# Patient Record
Sex: Female | Born: 1970 | Race: White | Hispanic: No | Marital: Married | Smoking: Never smoker
Health system: Southern US, Community
[De-identification: ages and names within clinical notes are randomized; demographics above are authoritative.]

## PROBLEM LIST (undated history)

## (undated) DIAGNOSIS — F419 Anxiety disorder, unspecified: Secondary | ICD-10-CM

## (undated) DIAGNOSIS — Z800 Family history of malignant neoplasm of digestive organs: Secondary | ICD-10-CM

## (undated) DIAGNOSIS — Z803 Family history of malignant neoplasm of breast: Secondary | ICD-10-CM

## (undated) HISTORY — DX: Family history of malignant neoplasm of digestive organs: Z80.0

## (undated) HISTORY — PX: WISDOM TOOTH EXTRACTION: SHX21

## (undated) HISTORY — DX: Family history of malignant neoplasm of breast: Z80.3

---

## 1998-04-24 ENCOUNTER — Inpatient Hospital Stay (HOSPITAL_COMMUNITY): Admission: AD | Admit: 1998-04-24 | Discharge: 1998-04-26 | Payer: Self-pay | Admitting: Obstetrics and Gynecology

## 1998-04-28 ENCOUNTER — Encounter: Admission: RE | Admit: 1998-04-28 | Discharge: 1998-07-27 | Payer: Self-pay | Admitting: Obstetrics and Gynecology

## 1999-06-04 ENCOUNTER — Other Ambulatory Visit: Admission: RE | Admit: 1999-06-04 | Discharge: 1999-06-04 | Payer: Self-pay | Admitting: Obstetrics and Gynecology

## 2000-07-06 ENCOUNTER — Other Ambulatory Visit: Admission: RE | Admit: 2000-07-06 | Discharge: 2000-07-06 | Payer: Self-pay | Admitting: Obstetrics and Gynecology

## 2000-08-05 ENCOUNTER — Encounter: Payer: Self-pay | Admitting: Internal Medicine

## 2000-08-05 ENCOUNTER — Encounter: Admission: RE | Admit: 2000-08-05 | Discharge: 2000-08-05 | Payer: Self-pay | Admitting: Internal Medicine

## 2001-08-10 ENCOUNTER — Other Ambulatory Visit: Admission: RE | Admit: 2001-08-10 | Discharge: 2001-08-10 | Payer: Self-pay | Admitting: Obstetrics and Gynecology

## 2002-08-24 ENCOUNTER — Other Ambulatory Visit: Admission: RE | Admit: 2002-08-24 | Discharge: 2002-08-24 | Payer: Self-pay | Admitting: Obstetrics and Gynecology

## 2003-08-25 ENCOUNTER — Other Ambulatory Visit: Admission: RE | Admit: 2003-08-25 | Discharge: 2003-08-25 | Payer: Self-pay | Admitting: Obstetrics and Gynecology

## 2004-09-27 ENCOUNTER — Other Ambulatory Visit: Admission: RE | Admit: 2004-09-27 | Discharge: 2004-09-27 | Payer: Self-pay | Admitting: Obstetrics and Gynecology

## 2005-11-11 ENCOUNTER — Other Ambulatory Visit: Admission: RE | Admit: 2005-11-11 | Discharge: 2005-11-11 | Payer: Self-pay | Admitting: Obstetrics and Gynecology

## 2016-03-24 ENCOUNTER — Other Ambulatory Visit: Payer: Self-pay | Admitting: Family Medicine

## 2016-03-24 DIAGNOSIS — E010 Iodine-deficiency related diffuse (endemic) goiter: Secondary | ICD-10-CM

## 2016-03-27 ENCOUNTER — Ambulatory Visit
Admission: RE | Admit: 2016-03-27 | Discharge: 2016-03-27 | Disposition: A | Discharge status: Home / Self Care | Payer: 59 | Source: Ambulatory Visit | Attending: Family Medicine | Admitting: Family Medicine

## 2016-03-27 DIAGNOSIS — E010 Iodine-deficiency related diffuse (endemic) goiter: Secondary | ICD-10-CM

## 2017-03-25 ENCOUNTER — Other Ambulatory Visit: Payer: Self-pay | Admitting: Surgery

## 2017-03-25 DIAGNOSIS — E042 Nontoxic multinodular goiter: Secondary | ICD-10-CM

## 2017-04-15 ENCOUNTER — Ambulatory Visit
Admission: RE | Admit: 2017-04-15 | Discharge: 2017-04-15 | Disposition: A | Discharge status: Home / Self Care | Payer: 59 | Source: Ambulatory Visit | Attending: Surgery | Admitting: Surgery

## 2017-04-15 DIAGNOSIS — E042 Nontoxic multinodular goiter: Secondary | ICD-10-CM

## 2019-01-19 ENCOUNTER — Other Ambulatory Visit: Payer: Self-pay | Admitting: Obstetrics and Gynecology

## 2019-01-19 DIAGNOSIS — R928 Other abnormal and inconclusive findings on diagnostic imaging of breast: Secondary | ICD-10-CM

## 2019-01-21 ENCOUNTER — Ambulatory Visit
Admission: RE | Admit: 2019-01-21 | Discharge: 2019-01-21 | Disposition: A | Discharge status: Home / Self Care | Payer: Managed Care, Other (non HMO) | Source: Ambulatory Visit | Attending: Obstetrics and Gynecology | Admitting: Obstetrics and Gynecology

## 2019-01-21 ENCOUNTER — Ambulatory Visit
Admission: RE | Admit: 2019-01-21 | Discharge: 2019-01-21 | Disposition: A | Discharge status: Home / Self Care | Payer: 59 | Source: Ambulatory Visit | Attending: Obstetrics and Gynecology | Admitting: Obstetrics and Gynecology

## 2019-01-21 ENCOUNTER — Other Ambulatory Visit: Payer: Self-pay | Admitting: Obstetrics and Gynecology

## 2019-01-21 DIAGNOSIS — R928 Other abnormal and inconclusive findings on diagnostic imaging of breast: Secondary | ICD-10-CM

## 2019-01-21 DIAGNOSIS — N6489 Other specified disorders of breast: Secondary | ICD-10-CM

## 2019-01-25 ENCOUNTER — Other Ambulatory Visit: Payer: 59

## 2019-01-26 ENCOUNTER — Ambulatory Visit
Admission: RE | Admit: 2019-01-26 | Discharge: 2019-01-26 | Disposition: A | Discharge status: Home / Self Care | Payer: Managed Care, Other (non HMO) | Source: Ambulatory Visit | Attending: Obstetrics and Gynecology | Admitting: Obstetrics and Gynecology

## 2019-01-26 DIAGNOSIS — N6489 Other specified disorders of breast: Secondary | ICD-10-CM

## 2019-02-04 ENCOUNTER — Ambulatory Visit: Payer: Self-pay | Admitting: General Surgery

## 2019-02-04 DIAGNOSIS — N6092 Unspecified benign mammary dysplasia of left breast: Secondary | ICD-10-CM

## 2019-02-08 ENCOUNTER — Other Ambulatory Visit: Payer: Self-pay | Admitting: General Surgery

## 2019-02-08 DIAGNOSIS — N6092 Unspecified benign mammary dysplasia of left breast: Secondary | ICD-10-CM

## 2019-02-24 ENCOUNTER — Other Ambulatory Visit: Payer: Self-pay | Admitting: General Surgery

## 2019-02-24 ENCOUNTER — Other Ambulatory Visit: Payer: Self-pay | Admitting: Family Medicine

## 2019-02-24 DIAGNOSIS — E041 Nontoxic single thyroid nodule: Secondary | ICD-10-CM

## 2019-03-08 ENCOUNTER — Telehealth: Payer: Self-pay | Admitting: Oncology

## 2019-03-08 NOTE — Telephone Encounter (Signed)
A new patient appt has been scheduled for the pt to be seen in the high risk breast clinic on 4/8 at 4pm to see Dr. Darnelle Catalan. Pt aware to arrive 15 minutes early.

## 2019-03-10 ENCOUNTER — Encounter: Payer: Managed Care, Other (non HMO) | Admitting: Licensed Clinical Social Worker

## 2019-03-15 ENCOUNTER — Other Ambulatory Visit: Payer: Self-pay | Admitting: Family Medicine

## 2019-03-15 DIAGNOSIS — E041 Nontoxic single thyroid nodule: Secondary | ICD-10-CM

## 2019-03-16 ENCOUNTER — Other Ambulatory Visit: Payer: Managed Care, Other (non HMO)

## 2019-03-23 ENCOUNTER — Other Ambulatory Visit: Payer: Managed Care, Other (non HMO)

## 2019-03-28 ENCOUNTER — Telehealth: Payer: Self-pay | Admitting: Oncology

## 2019-03-28 NOTE — Telephone Encounter (Signed)
Per 3/31 sch message - called patient to confirm appt - she wants to keep as is on 4/8

## 2019-03-29 NOTE — Progress Notes (Signed)
[facility]  Telephone:([phone]) [phone] [phone]) [phone]     ID: Lindsay Black DOB: January 05, 1971  MR#: [id number]  JAS#:[id number]  Patient Care Team: Ileana Ladd, MD as PCP - General (Family Medicine) Griselda Miner, MD as Consulting Physician (General Surgery) Magrinat, Valentino Hue, MD as Consulting Physician (Oncology) Candice Camp, MD as Consulting Physician (Obstetrics and Gynecology) Darnell Level, MD as Consulting Physician (General Surgery) Graylin Shiver, MD as Consulting Physician (Gastroenterology) Lowella Dell, MD OTHER MD:  CHIEF COMPLAINT: Atypical ductal hyperplasia  CURRENT TREATMENT: Intensified screening   HISTORY OF CURRENT ILLNESS: Lindsay Black had routine screening mammography on 01/17/2019 showing a possible abnormality in the left breast. She underwent bilateral diagnostic mammography with tomography and left breast ultrasonography at [facility] on 01/21/2019 showing: breast density category C; questionable, persistent distortion is seen on the CC projection in the central left breast; no suspicious left axillary lymphadenopathy.  Accordingly on 01/26/2019 she proceeded to biopsy of the left breast area in question. 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   Her definitive surgery is being postponed because of the current pandemic.  The patient's subsequent history is as detailed below.   INTERVAL HISTORY: Lindsay Black was evaluated in the high risk clinic on 03/30/2019 accompanied by her husband via FaceTime.   She is scheduled to undergo left breast lumpectomy on 05/06/2019.   REVIEW OF SYSTEMS: There were no specific symptoms leading to the original mammogram, which was routinely scheduled. The patient denies unusual headaches, visual changes, nausea, vomiting, stiff neck, dizziness, or gait imbalance. There has been no cough, phlegm production, or pleurisy, no chest  pain or pressure, and no change in bowel or bladder habits. The patient denies fever, rash, bleeding, unexplained fatigue or unexplained weight loss. A detailed review of systems was otherwise entirely negative.   PAST MEDICAL HISTORY: Thyroid nodules  PAST SURGICAL HISTORY: C-section, wisdom teeth extraction  FAMILY HISTORY Family History  Problem Relation Age of Onset  . Prostate cancer Father        brachytherapy  . Colon cancer Maternal Uncle   . Breast cancer Maternal Grandmother   . Colon cancer Maternal Grandfather    As of April 2020, patient's father is alive at 72 years old.  He has a history of prostate cancer.  Patient's mother is also alive at age 37. Maternal grandmother was diagnosed with breast cancer in her early 65s.  The patient has 1 sister, no brothers.   GYNECOLOGIC HISTORY:  No LMP recorded. Menarche: 48 years old Age at first live birth: 48 years old GX P 2 LMP within the last month Contraceptive currently on oral contraceptives HRT husband is status post vasectomy Hysterectomy? no BSO? 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 She is married. (Second) husband Lindsay Black is a Emergency planning/management officer for [employer]. Lindsay Black has two children from a previous marriage, ages 32 and 17, that live with them 50% of the time and with their mother the other 50%.  [name], 69 year old, who lives in [place] and works in Public relations account executive, and Lindsay Black, 49 year old, attending APP. at home also there are 4 dogs and 2 cats.  The patient is a International aid/development worker.     ADVANCED DIRECTIVES: Her sister, Lindsay Black, is her HCPOA.  She lives in [place] and can be reached at [phone]   HEALTH MAINTENANCE: Social History  Tobacco Use  . Smoking status: Not on file  Substance Use Topics  . Alcohol use: Not on file  . Drug use: Not on file     Colonoscopy: Jan 2020, repeat in 5 years  PAP: 01/17/2019, negative, Dr.  Rana Snare  Bone density: Jan 2020, osteopenia   No Known Allergies  Current Outpatient Medications  Medication Sig Dispense Refill  . norethindrone-ethinyl estradiol (JUNEL FE,GILDESS FE,LOESTRIN FE) 1-20 MG-MCG tablet Take 1 tablet by mouth daily.    . sertraline (ZOLOFT) 50 MG tablet Take 50 mg by mouth daily. Takes 1.5 tablets daily     No current facility-administered medications for this visit.     OBJECTIVE: Middle-aged white woman who appears well  There were no vitals filed for this visit.   There is no height or weight on file to calculate BMI.   Wt Readings from Last 3 Encounters:  No data found for Wt      ECOG FS:0 - Asymptomatic  Ocular: Sclerae unicteric, pupils round and equal Ear-nose-throat: Oropharynx clear and moist Lymphatic: No cervical or supraclavicular adenopathy Lungs no rales or rhonchi Heart regular rate and rhythm Abd soft, nontender, positive bowel sounds MSK no focal spinal tenderness, no joint edema Neuro: non-focal, well-oriented, appropriate affect Breasts: I do not palpate a mass in either breast.  There are no skin or nipple changes of concern.  Both axillae are benign.   LAB RESULTS:  CMP  No results found for: NA, K, CL, CO2, GLUCOSE, BUN, CREATININE, CALCIUM, PROT, ALBUMIN, AST, ALT, ALKPHOS, BILITOT, GFRNONAA, GFRAA  No results found for: TOTALPROTELP, ALBUMINELP, A1GS, A2GS, BETS, BETA2SER, GAMS, MSPIKE, SPEI  No results found for: KPAFRELGTCHN, LAMBDASER, KAPLAMBRATIO  No results found for: WBC, NEUTROABS, HGB, HCT, MCV, PLT  @  No results found for: LABCA2  No components found for: [id number]  No results for input(s): INR in the last 168 hours.  No results found for: LABCA2  No results found for: WUJ811  No results found for: BJY782  No results found for: NFA213  No results found for: CA2729  No components found for: HGQUANT  No results found for: CEA1 / No results found for: CEA1   No results found  for: AFPTUMOR  No results found for: CHROMOGRNA  No results found for: PSA1  No visits with results within 3 Day(s) from this visit.  Latest known visit with results is:  No results found for any previous visit.    (this displays the last labs from the last 3 days)  No results found for: TOTALPROTELP, ALBUMINELP, A1GS, A2GS, BETS, BETA2SER, GAMS, MSPIKE, SPEI (this displays SPEP labs)  No results found for: KPAFRELGTCHN, LAMBDASER, KAPLAMBRATIO (kappa/lambda light chains)  No results found for: HGBA, HGBA2QUANT, HGBFQUANT, HGBSQUAN (Hemoglobinopathy evaluation)   No results found for: LDH  No results found for: IRON, TIBC, IRONPCTSAT (Iron and TIBC)  No results found for: FERRITIN  Urinalysis No results found for: COLORURINE, APPEARANCEUR, LABSPEC, PHURINE, GLUCOSEU, HGBUR, BILIRUBINUR, KETONESUR, PROTEINUR, UROBILINOGEN, NITRITE, LEUKOCYTESUR   STUDIES: No results found.  ELIGIBLE FOR AVAILABLE RESEARCH PROTOCOL: no  ASSESSMENT: 48 y.o. [place] woman status post left breast biopsy 01/26/2019 showing atypical ductal hyperplasia  (1) definitive surgery pending  (2) opted against risk reduction with antiestrogens  (3) intensified screening to start with breast MRI August 2020    PLAN: I spent approximately 50 minutes face to face with Lindsay Black with more than 50% of that time spent in counseling and coordination of care. Specifically we reviewed the  biology of the patient's diagnosis and the specifics of her situation.  Lindsay Black understands ADH means, first, relatively unrestricted growth of breast cells and, in addition, some morphologically different features from simple hyperplasia. Atypical ductal hyperplasia can be difficult to tell apart ductal cancer in situ. For those reasons lumpectomy was indicated.  ADH is also a marker of breast cancer risk. The risk of developing breast cancer in patients with ADH approaches 1% per year. The new cancer can develop in either  breast. One half of those tumors would be invasive.  Assuming an average life span for women in the [place] today this means her risk of developing breast cancer is in the 30-35% range.  There are 2 ways of dealing with this problem.  One is risk reduction.  The other one is intensified screening.  One way to reduce the risk is bilateral mastectomies.  We discussed this in detail and she understands we do not have data that this saves lives and it would be a very extreme approach to risk reduction in this setting.  She is appropriately not interested in this option.  Another approach to risk reduction is antiestrogens.  Aromatase inhibitors [anastrozole, letrozole and exemestane] are not a choice in pre-menopausal patients. Tamoxifen or raloxefene can be used in pre- or post-menopausal patients.   We discussed these agents including their possible toxicities, side effects and complications.  We also discussed the fact that antiestrogens taken for 5 years would cut her risk of breast cancer in half.   However this does mean that she would need to stop her birth control pills, which she is using not for birth control since her husband is status post vasectomy but for regulation of her menstrual periods.  She understands we think oral contraceptives do slightly increase breast cancer risk, but minimally so.  However going for antiestrogens likely would mean earlier menopause.  This is something she very much prefers to avoid.  After discussing the options for risk reduction, we discussed intensified screening.  This would include yearly mammography with tomography, biannual breast exams by an MD, and a yearly breast MRI.  Lindsay Black understands that adding the MRI may increase cost and also put her at risk for false positives, which may lead to unnecessary procedures.  The point of course would be to find any cancer that may develop at the very earliest stages to increase the chance of cure with minimal interventions  (specifically without chemotherapy).  Lindsay Black was very clear that she does not wish to switch to antiestrogens, but is interested in intensified screening.  Since she had her diagnostic mammogram in February, she will have a breast MRI in August and see me in September.  To continue in this fashion then as she would have her next mammogram in February and then see Lindsay Black for breast exam shortly after that.  Lindsay Black has a good understanding of the overall plan. She agrees with it. She will call with any problems that may develop before her next visit here.   Lowella DellGustav C Magrinat, MD   03/30/2019 5:18 PM Medical Oncology and Hematology [facility] [address] [place], [place] [address] Tel. [phone]    Fax. [phone]   This document serves as a record of services personally performed by Ruthann CancerGustav Magrinat, MD. It was created on his behalf by Mickie BailKatie Daubenspeck, a trained medical scribe. The creation of this record is based on the scribe's personal observations and the provider's statements to them.  I, Lurline Del MD, have reviewed the above documentation for accuracy and completeness, and I agree with the above.

## 2019-03-30 ENCOUNTER — Other Ambulatory Visit: Payer: Self-pay

## 2019-03-30 ENCOUNTER — Inpatient Hospital Stay: Payer: Managed Care, Other (non HMO) | Attending: Oncology | Admitting: Oncology

## 2019-03-30 ENCOUNTER — Encounter: Payer: Self-pay | Admitting: Oncology

## 2019-03-30 DIAGNOSIS — N6092 Unspecified benign mammary dysplasia of left breast: Secondary | ICD-10-CM | POA: Insufficient documentation

## 2019-03-30 DIAGNOSIS — Z9189 Other specified personal risk factors, not elsewhere classified: Secondary | ICD-10-CM | POA: Insufficient documentation

## 2019-05-06 ENCOUNTER — Ambulatory Visit (HOSPITAL_BASED_OUTPATIENT_CLINIC_OR_DEPARTMENT_OTHER): Admit: 2019-05-06 | Payer: Managed Care, Other (non HMO) | Admitting: General Surgery

## 2019-05-06 ENCOUNTER — Encounter (HOSPITAL_BASED_OUTPATIENT_CLINIC_OR_DEPARTMENT_OTHER): Payer: Self-pay

## 2019-05-06 SURGERY — BREAST LUMPECTOMY WITH RADIOACTIVE SEED LOCALIZATION
Anesthesia: General | Site: Breast | Laterality: Left

## 2019-05-18 ENCOUNTER — Other Ambulatory Visit: Payer: Managed Care, Other (non HMO)

## 2019-05-19 ENCOUNTER — Other Ambulatory Visit: Payer: Self-pay

## 2019-05-19 ENCOUNTER — Encounter (HOSPITAL_BASED_OUTPATIENT_CLINIC_OR_DEPARTMENT_OTHER): Payer: Self-pay | Admitting: *Deleted

## 2019-05-23 ENCOUNTER — Other Ambulatory Visit: Payer: Self-pay

## 2019-05-23 ENCOUNTER — Other Ambulatory Visit (HOSPITAL_COMMUNITY)
Admission: RE | Admit: 2019-05-23 | Discharge: 2019-05-23 | Disposition: A | Discharge status: Home / Self Care | Payer: Managed Care, Other (non HMO) | Source: Ambulatory Visit | Attending: General Surgery | Admitting: General Surgery

## 2019-05-23 DIAGNOSIS — Z1159 Encounter for screening for other viral diseases: Secondary | ICD-10-CM | POA: Diagnosis not present

## 2019-05-23 HISTORY — PX: BREAST EXCISIONAL BIOPSY: SUR124

## 2019-05-23 LAB — SARS CORONAVIRUS 2 BY RT PCR (HOSPITAL ORDER, PERFORMED IN ~~LOC~~ HOSPITAL LAB): SARS Coronavirus 2: NEGATIVE

## 2019-05-25 ENCOUNTER — Ambulatory Visit
Admission: RE | Admit: 2019-05-25 | Discharge: 2019-05-25 | Disposition: A | Discharge status: Home / Self Care | Payer: Managed Care, Other (non HMO) | Source: Ambulatory Visit | Attending: General Surgery | Admitting: General Surgery

## 2019-05-25 ENCOUNTER — Other Ambulatory Visit: Payer: Self-pay

## 2019-05-25 DIAGNOSIS — N6092 Unspecified benign mammary dysplasia of left breast: Secondary | ICD-10-CM

## 2019-05-25 NOTE — Progress Notes (Signed)
Ensure pre surgery drink given with instructions, pt verbalized understanding. 

## 2019-05-26 ENCOUNTER — Ambulatory Visit (HOSPITAL_BASED_OUTPATIENT_CLINIC_OR_DEPARTMENT_OTHER)
Admission: RE | Admit: 2019-05-26 | Discharge: 2019-05-26 | Disposition: A | Discharge status: Home / Self Care | Payer: Managed Care, Other (non HMO) | Attending: General Surgery | Admitting: General Surgery

## 2019-05-26 ENCOUNTER — Encounter (HOSPITAL_BASED_OUTPATIENT_CLINIC_OR_DEPARTMENT_OTHER)
Admission: RE | Disposition: A | Discharge status: Home / Self Care | Payer: Self-pay | Source: Home / Self Care | Attending: General Surgery

## 2019-05-26 ENCOUNTER — Encounter (HOSPITAL_BASED_OUTPATIENT_CLINIC_OR_DEPARTMENT_OTHER): Payer: Self-pay | Admitting: Anesthesiology

## 2019-05-26 ENCOUNTER — Ambulatory Visit (HOSPITAL_BASED_OUTPATIENT_CLINIC_OR_DEPARTMENT_OTHER): Payer: Managed Care, Other (non HMO) | Admitting: Anesthesiology

## 2019-05-26 ENCOUNTER — Ambulatory Visit
Admission: RE | Admit: 2019-05-26 | Discharge: 2019-05-26 | Disposition: A | Discharge status: Home / Self Care | Payer: Managed Care, Other (non HMO) | Source: Ambulatory Visit | Attending: General Surgery | Admitting: General Surgery

## 2019-05-26 ENCOUNTER — Other Ambulatory Visit: Payer: Self-pay

## 2019-05-26 DIAGNOSIS — Z803 Family history of malignant neoplasm of breast: Secondary | ICD-10-CM | POA: Diagnosis not present

## 2019-05-26 DIAGNOSIS — N6092 Unspecified benign mammary dysplasia of left breast: Secondary | ICD-10-CM

## 2019-05-26 DIAGNOSIS — N6012 Diffuse cystic mastopathy of left breast: Secondary | ICD-10-CM | POA: Diagnosis not present

## 2019-05-26 DIAGNOSIS — Z79899 Other long term (current) drug therapy: Secondary | ICD-10-CM | POA: Insufficient documentation

## 2019-05-26 HISTORY — DX: Anxiety disorder, unspecified: F41.9

## 2019-05-26 HISTORY — PX: BREAST LUMPECTOMY WITH RADIOACTIVE SEED LOCALIZATION: SHX6424

## 2019-05-26 LAB — POCT PREGNANCY, URINE: Preg Test, Ur: NEGATIVE

## 2019-05-26 SURGERY — BREAST LUMPECTOMY WITH RADIOACTIVE SEED LOCALIZATION
Anesthesia: General | Site: Breast | Laterality: Left

## 2019-05-26 MED ORDER — LIDOCAINE 2% (20 MG/ML) 5 ML SYRINGE
INTRAMUSCULAR | Status: AC
  Filled 2019-05-26: qty 5

## 2019-05-26 MED ORDER — FENTANYL CITRATE (PF) 100 MCG/2ML IJ SOLN
INTRAMUSCULAR | Status: AC
  Filled 2019-05-26: qty 2

## 2019-05-26 MED ORDER — PROPOFOL 10 MG/ML IV BOLUS
INTRAVENOUS | Status: DC | PRN
  Administered 2019-05-26: 150 mg via INTRAVENOUS

## 2019-05-26 MED ORDER — HYDROMORPHONE HCL 1 MG/ML IJ SOLN: 0.2500 mg | INTRAMUSCULAR | Status: DC | PRN

## 2019-05-26 MED ORDER — EPHEDRINE SULFATE 50 MG/ML IJ SOLN
INTRAMUSCULAR | Status: DC | PRN
  Administered 2019-05-26: 10 mg via INTRAVENOUS

## 2019-05-26 MED ORDER — FENTANYL CITRATE (PF) 100 MCG/2ML IJ SOLN
100.0000 ug | INTRAMUSCULAR | Status: DC | PRN
  Administered 2019-05-26: 50 ug via INTRAVENOUS

## 2019-05-26 MED ORDER — BUPIVACAINE-EPINEPHRINE (PF) 0.25% -1:200000 IJ SOLN
INTRAMUSCULAR | Status: DC | PRN
  Administered 2019-05-26: 20 mL

## 2019-05-26 MED ORDER — LACTATED RINGERS IV SOLN
INTRAVENOUS | Status: DC
  Administered 2019-05-26: 11:00:00 via INTRAVENOUS

## 2019-05-26 MED ORDER — DEXAMETHASONE SODIUM PHOSPHATE 4 MG/ML IJ SOLN
INTRAMUSCULAR | Status: DC | PRN
  Administered 2019-05-26: 10 mg via INTRAVENOUS

## 2019-05-26 MED ORDER — MIDAZOLAM HCL 2 MG/2ML IJ SOLN
1.0000 mg | INTRAMUSCULAR | Status: DC | PRN
  Administered 2019-05-26: 2 mg via INTRAVENOUS

## 2019-05-26 MED ORDER — EPHEDRINE 5 MG/ML INJ
INTRAVENOUS | Status: AC
  Filled 2019-05-26: qty 10

## 2019-05-26 MED ORDER — OXYCODONE HCL 5 MG/5ML PO SOLN: 5.0000 mg | Freq: Once | ORAL | Status: DC | PRN

## 2019-05-26 MED ORDER — MEPERIDINE HCL 25 MG/ML IJ SOLN: 6.2500 mg | INTRAMUSCULAR | Status: DC | PRN

## 2019-05-26 MED ORDER — CEFAZOLIN SODIUM-DEXTROSE 2-3 GM-%(50ML) IV SOLR
INTRAVENOUS | Status: DC | PRN
  Administered 2019-05-26: 2 g via INTRAVENOUS

## 2019-05-26 MED ORDER — SCOPOLAMINE 1 MG/3DAYS TD PT72: 1.0000 | MEDICATED_PATCH | Freq: Once | TRANSDERMAL | Status: DC | PRN

## 2019-05-26 MED ORDER — ONDANSETRON HCL 4 MG/2ML IJ SOLN
INTRAMUSCULAR | Status: AC
  Filled 2019-05-26: qty 2

## 2019-05-26 MED ORDER — HYDROCODONE-ACETAMINOPHEN 5-325 MG PO TABS: 1.0000 | ORAL_TABLET | Freq: Four times a day (QID) | ORAL | 0 refills | Status: DC | PRN

## 2019-05-26 MED ORDER — LIDOCAINE 2% (20 MG/ML) 5 ML SYRINGE
INTRAMUSCULAR | Status: DC | PRN
  Administered 2019-05-26: 60 mg via INTRAVENOUS

## 2019-05-26 MED ORDER — FENTANYL CITRATE (PF) 100 MCG/2ML IJ SOLN
50.0000 ug | INTRAMUSCULAR | Status: DC | PRN
  Administered 2019-05-26 (×2): 50 ug via INTRAVENOUS

## 2019-05-26 MED ORDER — ONDANSETRON HCL 4 MG/2ML IJ SOLN
INTRAMUSCULAR | Status: DC | PRN
  Administered 2019-05-26: 4 mg via INTRAVENOUS

## 2019-05-26 MED ORDER — OXYCODONE HCL 5 MG PO TABS: 5.0000 mg | ORAL_TABLET | Freq: Once | ORAL | Status: DC | PRN

## 2019-05-26 MED ORDER — PROMETHAZINE HCL 25 MG/ML IJ SOLN: 6.2500 mg | INTRAMUSCULAR | Status: DC | PRN

## 2019-05-26 MED ORDER — MIDAZOLAM HCL 2 MG/2ML IJ SOLN
INTRAMUSCULAR | Status: AC
  Filled 2019-05-26: qty 2

## 2019-05-26 MED ORDER — DEXAMETHASONE SODIUM PHOSPHATE 10 MG/ML IJ SOLN
INTRAMUSCULAR | Status: AC
  Filled 2019-05-26: qty 1

## 2019-05-26 SURGICAL SUPPLY — 43 items
APPLIER CLIP 9.375 MED OPEN (MISCELLANEOUS) ×3
BLADE SURG 15 STRL LF DISP TIS (BLADE) ×1 IMPLANT
BLADE SURG 15 STRL SS (BLADE) ×2
CANISTER SUC SOCK COL 7IN (MISCELLANEOUS) ×3 IMPLANT
CANISTER SUCT 1200ML W/VALVE (MISCELLANEOUS) ×3 IMPLANT
CHLORAPREP W/TINT 26 (MISCELLANEOUS) ×3 IMPLANT
CLIP APPLIE 9.375 MED OPEN (MISCELLANEOUS) ×1 IMPLANT
COVER BACK TABLE REUSABLE LG (DRAPES) ×3 IMPLANT
COVER MAYO STAND REUSABLE (DRAPES) ×3 IMPLANT
COVER PROBE W GEL 5X96 (DRAPES) ×3 IMPLANT
COVER WAND RF STERILE (DRAPES) IMPLANT
DECANTER SPIKE VIAL GLASS SM (MISCELLANEOUS) IMPLANT
DERMABOND ADVANCED (GAUZE/BANDAGES/DRESSINGS) ×2
DERMABOND ADVANCED .7 DNX12 (GAUZE/BANDAGES/DRESSINGS) ×1 IMPLANT
DRAPE LAPAROSCOPIC ABDOMINAL (DRAPES) ×3 IMPLANT
DRAPE UTILITY XL STRL (DRAPES) ×3 IMPLANT
ELECT COATED BLADE 2.86 ST (ELECTRODE) ×3 IMPLANT
ELECT REM PT RETURN 9FT ADLT (ELECTROSURGICAL) ×3
ELECTRODE REM PT RTRN 9FT ADLT (ELECTROSURGICAL) ×1 IMPLANT
GLOVE BIO SURGEON STRL SZ7.5 (GLOVE) ×6 IMPLANT
GLOVE BIOGEL PI IND STRL 7.0 (GLOVE) ×1 IMPLANT
GLOVE BIOGEL PI INDICATOR 7.0 (GLOVE) ×2
GLOVE SURG SS PI 6.5 STRL IVOR (GLOVE) ×3 IMPLANT
GOWN STRL REUS W/ TWL LRG LVL3 (GOWN DISPOSABLE) ×2 IMPLANT
GOWN STRL REUS W/TWL LRG LVL3 (GOWN DISPOSABLE) ×4
ILLUMINATOR WAVEGUIDE N/F (MISCELLANEOUS) IMPLANT
KIT MARKER MARGIN INK (KITS) ×3 IMPLANT
LIGHT WAVEGUIDE WIDE FLAT (MISCELLANEOUS) IMPLANT
NEEDLE HYPO 25X1 1.5 SAFETY (NEEDLE) ×3 IMPLANT
NS IRRIG 1000ML POUR BTL (IV SOLUTION) IMPLANT
PACK BASIN DAY SURGERY FS (CUSTOM PROCEDURE TRAY) ×3 IMPLANT
PENCIL BUTTON HOLSTER BLD 10FT (ELECTRODE) ×3 IMPLANT
SLEEVE SCD COMPRESS KNEE MED (MISCELLANEOUS) ×3 IMPLANT
SPONGE LAP 18X18 RF (DISPOSABLE) ×3 IMPLANT
SUT MON AB 4-0 PC3 18 (SUTURE) ×3 IMPLANT
SUT SILK 2 0 SH (SUTURE) IMPLANT
SUT VICRYL 3-0 CR8 SH (SUTURE) ×3 IMPLANT
SYR CONTROL 10ML LL (SYRINGE) ×3 IMPLANT
TOWEL GREEN STERILE FF (TOWEL DISPOSABLE) ×3 IMPLANT
TRAY FAXITRON CT DISP (TRAY / TRAY PROCEDURE) ×3 IMPLANT
TUBE CONNECTING 20'X1/4 (TUBING)
TUBE CONNECTING 20X1/4 (TUBING) IMPLANT
YANKAUER SUCT BULB TIP NO VENT (SUCTIONS) IMPLANT

## 2019-05-26 NOTE — Anesthesia Preprocedure Evaluation (Signed)
Anesthesia Evaluation  Patient identified by MRN, date of birth, ID band Patient awake    Reviewed: Allergy & Precautions, NPO status , Patient's Chart, lab work & pertinent test results  Airway Mallampati: II  TM Distance: >3 FB Neck ROM: Full    Dental no notable dental hx.    Pulmonary neg pulmonary ROS,    Pulmonary exam normal breath sounds clear to auscultation       Cardiovascular negative cardio ROS Normal cardiovascular exam Rhythm:Regular Rate:Normal     Neuro/Psych Anxiety negative neurological ROS  negative psych ROS   GI/Hepatic negative GI ROS, Neg liver ROS,   Endo/Other  negative endocrine ROS  Renal/GU negative Renal ROS  negative genitourinary   Musculoskeletal negative musculoskeletal ROS (+)   Abdominal   Peds negative pediatric ROS (+)  Hematology negative hematology ROS (+)   Anesthesia Other Findings Breast Cancer  Reproductive/Obstetrics negative OB ROS                             Anesthesia Physical Anesthesia Plan  ASA: III  Anesthesia Plan: General   Post-op Pain Management:    Induction: Intravenous  PONV Risk Score and Plan: 3 and Ondansetron, Dexamethasone, Midazolam and Treatment may vary due to age or medical condition  Airway Management Planned: LMA  Additional Equipment:   Intra-op Plan:   Post-operative Plan: Extubation in OR  Informed Consent: I have reviewed the patients History and Physical, chart, labs and discussed the procedure including the risks, benefits and alternatives for the proposed anesthesia with the patient or authorized representative who has indicated his/her understanding and acceptance.     Dental advisory given  Plan Discussed with: CRNA  Anesthesia Plan Comments:         Anesthesia Quick Evaluation

## 2019-05-26 NOTE — H&P (Signed)
Lindsay Black  Location: [facility] Patient #: [id number] DOB: 1971/03/09 Married / Language: English / Race: White Female   History of Present Illness  The patient is a 49 year old female who presents with a breast mass. We are asked to see the patient in consultation by Dr. Candice Camp to evaluate her for an area of atypical duct hyperplasia in the left breast. The patient is a 48 year old white female who recently went for a routine screening mammogram. At that time she was found to have a small area of distortion in the 6 o'clock position of the left breast. This was biopsied and came back as atypical duct hyperplasia with a complex sclerosing lesion. She denies any breast pain or discharge from the nipple. Her family history is significant for breast cancer in a maternal grandmother and colon cancer in a grandfather. She does not smoke. She does take hormone replacement. She did have a colonoscopy recently that was clean.   Problem List/Past Medical  MULTIPLE THYROID NODULES (E04.2)  ATYPICAL DUCTAL HYPERPLASIA OF LEFT BREAST (N60.92)   Past Surgical History Cesarean Section - 1  Oral Surgery   Diagnostic Studies History  Colonoscopy  1-5 years ago Mammogram  1-3 years ago Pap Smear  1-5 years ago  Allergies No Known Drug Allergies  Allergies Reconciled   Medication History  Sertraline HCl (50MG  Tablet, Oral) Active. Blisovi 24 Fe (1-20MG -MCG(24) Tablet, Oral) Active. Medications Reconciled  Social History  Alcohol use  Occasional alcohol use. Caffeine use  Coffee. No drug use  Tobacco use  Never smoker.  Family History  Arthritis  Mother. Colon Polyps  Father, Mother. Hypertension  Father. Prostate Cancer  Father.  Pregnancy / Birth History  Age at menarche  10 years. Contraceptive History  Oral contraceptives. Gravida  3 Maternal age  1-25 Para  2 Regular periods   Other Problems  Anxiety Disorder     Review  of Systems General Not Present- Appetite Loss, Chills, Fatigue, Fever, Night Sweats, Weight Gain and Weight Loss. Note: All other systems negative (unless as noted in HPI & included Review of Systems) Skin Not Present- Change in Wart/Mole, Dryness, Hives, Jaundice, New Lesions, Non-Healing Wounds, Rash and Ulcer. HEENT Not Present- Earache, Hearing Loss, Hoarseness, Nose Bleed, Oral Ulcers, Ringing in the Ears, Seasonal Allergies, Sinus Pain, Sore Throat, Visual Disturbances, Wears glasses/contact lenses and Yellow Eyes. Respiratory Not Present- Bloody sputum, Chronic Cough, Difficulty Breathing, Snoring and Wheezing. Breast Not Present- Breast Mass, Breast Pain, Nipple Discharge and Skin Changes. Cardiovascular Not Present- Chest Pain, Difficulty Breathing Lying Down, Leg Cramps, Palpitations, Rapid Heart Rate, Shortness of Breath and Swelling of Extremities. Gastrointestinal Not Present- Abdominal Pain, Bloating, Bloody Stool, Change in Bowel Habits, Chronic diarrhea, Constipation, Difficulty Swallowing, Excessive gas, Gets full quickly at meals, Hemorrhoids, Indigestion, Nausea, Rectal Pain and Vomiting. Female Genitourinary Not Present- Frequency, Nocturia, Painful Urination, Pelvic Pain and Urgency. Musculoskeletal Not Present- Back Pain, Joint Pain, Joint Stiffness, Muscle Pain, Muscle Weakness and Swelling of Extremities. Neurological Not Present- Decreased Memory, Fainting, Headaches, Numbness, Seizures, Tingling, Tremor, Trouble walking and Weakness. Psychiatric Not Present- Anxiety, Bipolar, Change in Sleep Pattern, Depression, Fearful and Frequent crying. Endocrine Not Present- Cold Intolerance, Excessive Hunger, Hair Changes, Heat Intolerance, Hot flashes and New Diabetes. Hematology Not Present- Easy Bruising, Excessive bleeding, Gland problems, HIV and Persistent Infections.  Vitals  Weight: 135 lb Height: 60in Body Surface Area: 1.58 m Body Mass Index: 26.37 kg/m  Temp.:  97.4F(Oral)  Pulse: 80 (Regular)  BP:  110/72 (Sitting, Left Arm, Standard)       Physical Exam  General Mental Status-Alert. General Appearance-Consistent with stated age. Hydration-Well hydrated. Voice-Normal.  Head and Neck Head-normocephalic, atraumatic with no lesions or palpable masses. Trachea-midline. Thyroid Gland Characteristics - normal size and consistency.  Eye Eyeball - Bilateral-Extraocular movements intact. Sclera/Conjunctiva - Bilateral-No scleral icterus.  Chest and Lung Exam Chest and lung exam reveals -quiet, even and easy respiratory effort with no use of accessory muscles and on auscultation, normal breath sounds, no adventitious sounds and normal vocal resonance. Inspection Chest Wall - Normal. Back - normal.  Breast Note: There is a small bruise in the 6 o'clock position of the left breast. There is no palpable mass in either breast. There is no palpable axillary, supraclavicular, or cervical lymphadenopathy.   Cardiovascular Cardiovascular examination reveals -normal heart sounds, regular rate and rhythm with no murmurs and normal pedal pulses bilaterally.  Abdomen Inspection Inspection of the abdomen reveals - No Hernias. Skin - Scar - no surgical scars. Palpation/Percussion Palpation and Percussion of the abdomen reveal - Soft, Non Tender, No Rebound tenderness, No Rigidity (guarding) and No hepatosplenomegaly. Auscultation Auscultation of the abdomen reveals - Bowel sounds normal.  Neurologic Neurologic evaluation reveals -alert and oriented x 3 with no impairment of recent or remote memory. Mental Status-Normal.  Musculoskeletal Normal Exam - Left-Upper Extremity Strength Normal and Lower Extremity Strength Normal. Normal Exam - Right-Upper Extremity Strength Normal and Lower Extremity Strength Normal.  Lymphatic Head & Neck  General Head & Neck Lymphatics: Bilateral - Description -  Normal. Axillary  General Axillary Region: Bilateral - Description - Normal. Tenderness - Non Tender. Femoral & Inguinal  Generalized Femoral & Inguinal Lymphatics: Bilateral - Description - Normal. Tenderness - Non Tender.    Assessment & Plan  ATYPICAL DUCTAL HYPERPLASIA OF LEFT BREAST (N60.92) Impression: The patient appears to have a very small area of atypical duct hyperplasia in the 6 o'clock position of the left breast. Because this can have an appearance similar to ductal carcinoma in situ and because it is considered a high risk lesion would recommend that this area be removed. She would also like to have this done. I have discussed with her in detail the risks and benefits of the operation as well as some of the technical aspects and she understands and wishes to proceed. I'll plan for a left breast radioactive seed localized lumpectomy. We'll also go ahead and refer her to the high risk clinic to talk about risk reduction. Having this diagnosis raises her lifetime risk of breast cancer to somewhere between 30 and 40%. Current Plans Referred to Oncology, for evaluation and follow up (Oncology). Routine. Pt Education - Breast Diseases: discussed with patient and provided information.

## 2019-05-26 NOTE — Progress Notes (Signed)
Computers lost internet, called Dr. Chaney Malling ordered to give Fentanyl IV for pain if no allergies.

## 2019-05-26 NOTE — Op Note (Signed)
05/26/2019  12:00 PM  PATIENT:  Lindsay Black  48 y.o. female  PRE-OPERATIVE DIAGNOSIS:  LEFT BREAST ATYPICAL DUCTAL HYPERPLASIA  POST-OPERATIVE DIAGNOSIS:  LEFT BREAST ATYPICAL DUCTAL HYPERPLASIA  PROCEDURE:  Procedure(s): LEFT BREAST LUMPECTOMY WITH RADIOACTIVE SEED LOCALIZATION (Left)  SURGEON:  Surgeon(s) and Role:    * Griselda Miner, MD - Primary  PHYSICIAN ASSISTANT:   ASSISTANTS: none   ANESTHESIA:   local and general  EBL:  minimal   BLOOD ADMINISTERED:none  DRAINS: none   LOCAL MEDICATIONS USED:  MARCAINE     SPECIMEN:  Source of Specimen:  left breast tissue  DISPOSITION OF SPECIMEN:  PATHOLOGY  COUNTS:  YES  TOURNIQUET:  * No tourniquets in log *  DICTATION: .Dragon Dictation   After informed consent was obtained the patient was brought to the operating room and placed in the supine position on the operating table.  After adequate induction of general anesthesia the patient's left breast was prepped with ChloraPrep, allowed to dry, and draped in usual sterile manner.  An appropriate timeout was performed.  Previously an I-125 seed was placed in the lower central left breast to mark an area of atypical ductal hyperplasia.  The neoprobe was set to I-125 in the area of radioactivity was readily identified.  The inferior subareolar area of the breast was then infiltrated with quarter percent Marcaine.  A small incision was made with a 15 blade knife along the lower edge of the areola.  The incision was carried through the skin and subcutaneous tissue sharply with the electrocautery.  Dissection was then carried out towards the radioactive seed under the direction of the neoprobe.  Once I more closely approached the radioactive seed I then removed a circular portion of breast tissue sharply with the electrocautery around the radioactive seed while checking the area of radioactivity frequently.  Once the specimen was removed it was oriented with the appropriate paint  colors.  A specimen radiograph was obtained that showed the clip and seed to be within the specimen.  The specimen was then sent to pathology for further evaluation.  Hemostasis was achieved using the Bovie electrocautery.  The wound was irrigated with saline and infiltrated with more quarter percent Marcaine.  The deep layer of the wound was then closed with interrupted 3-0 Vicryl stitches.  The skin was then closed with interrupted 4-0 Monocryl subcuticular stitches.  Dermabond dressings were applied.  The patient tolerated the procedure well.  At the end of the case all needle sponge and instrument counts were correct.  The patient was then awakened and taken to recovery in stable condition.  PLAN OF CARE: Discharge to home after PACU  PATIENT DISPOSITION:  PACU - hemodynamically stable.   Delay start of Pharmacological VTE agent (>24hrs) due to surgical blood loss or risk of bleeding: not applicable

## 2019-05-26 NOTE — Transfer of Care (Signed)
Immediate Anesthesia Transfer of Care Note  Patient: Lindsay Black  Procedure(s) Performed: LEFT BREAST LUMPECTOMY WITH RADIOACTIVE SEED LOCALIZATION (Left Breast)  Patient Location: PACU  Anesthesia Type:General  Level of Consciousness: awake, alert  and oriented  Airway & Oxygen Therapy: Patient Spontanous Breathing and Patient connected to face mask oxygen  Post-op Assessment: Report given to RN and Post -op Vital signs reviewed and stable  Post vital signs: Reviewed and stable  Last Vitals:  Vitals Value Taken Time  BP    Temp    Pulse 95 05/26/2019 12:04 PM  Resp 19 05/26/2019 12:04 PM  SpO2 100 % 05/26/2019 12:04 PM  Vitals shown include unvalidated device data.  Last Pain:  Vitals:   05/26/19 1034  TempSrc: Oral  PainSc: 0-No pain         Complications: No apparent anesthesia complications

## 2019-05-26 NOTE — Anesthesia Procedure Notes (Signed)
Procedure Name: LMA Insertion Date/Time: 05/26/2019 11:18 AM Performed by: Caren Macadam, CRNA Pre-anesthesia Checklist: Patient identified, Emergency Drugs available, Suction available and Patient being monitored Patient Re-evaluated:Patient Re-evaluated prior to induction Oxygen Delivery Method: Circle system utilized Preoxygenation: Pre-oxygenation with 100% oxygen Induction Type: IV induction Ventilation: Mask ventilation without difficulty LMA: LMA inserted LMA Size: 4.0 Number of attempts: 1 Placement Confirmation: positive ETCO2 and breath sounds checked- equal and bilateral Tube secured with: Tape Dental Injury: Teeth and Oropharynx as per pre-operative assessment

## 2019-05-26 NOTE — Interval H&P Note (Signed)
History and Physical Interval Note:  05/26/2019 10:55 AM  Lindsay Black  has presented today for surgery, with the diagnosis of LEFT BREAST ATYPICAL DUCTAL HYPERPLASIA.  The various methods of treatment have been discussed with the patient and family. After consideration of risks, benefits and other options for treatment, the patient has consented to  Procedure(s): LEFT BREAST LUMPECTOMY WITH RADIOACTIVE SEED LOCALIZATION (Left) as a surgical intervention.  The patient's history has been reviewed, patient examined, no change in status, stable for surgery.  I have reviewed the patient's chart and labs.  Questions were answered to the patient's satisfaction.     Chevis Pretty III

## 2019-05-26 NOTE — Anesthesia Postprocedure Evaluation (Signed)
Anesthesia Post Note  Patient: Lindsay Black  Procedure(s) Performed: LEFT BREAST LUMPECTOMY WITH RADIOACTIVE SEED LOCALIZATION (Left Breast)     Patient location during evaluation: PACU Anesthesia Type: General Level of consciousness: awake and alert Pain management: pain level controlled Vital Signs Assessment: post-procedure vital signs reviewed and stable Respiratory status: spontaneous breathing, nonlabored ventilation, respiratory function stable and patient connected to nasal cannula oxygen Cardiovascular status: blood pressure returned to baseline and stable Postop Assessment: no apparent nausea or vomiting Anesthetic complications: no    Last Vitals:  Vitals:   05/26/19 1300 05/26/19 1315  BP: 117/73 130/72  Pulse: 73 78  Resp: (!) 25 20  Temp:  36.9 C  SpO2: 99% 100%    Last Pain:  Vitals:   05/26/19 1315  TempSrc:   PainSc: 0-No pain                 Ryen Rhames S

## 2019-05-26 NOTE — Discharge Instructions (Signed)
  Post Anesthesia Home Care Instructions  Activity: Get plenty of rest for the remainder of the day. A responsible individual must stay with you for 24 hours following the procedure.  For the next 24 hours, DO NOT: -Drive a car -Operate machinery -Drink alcoholic beverages -Take any medication unless instructed by your physician -Make any legal decisions or sign important papers.  Meals: Start with liquid foods such as gelatin or soup. Progress to regular foods as tolerated. Avoid greasy, spicy, heavy foods. If nausea and/or vomiting occur, drink only clear liquids until the nausea and/or vomiting subsides. Call your physician if vomiting continues.  Special Instructions/Symptoms: Your throat may feel dry or sore from the anesthesia or the breathing tube placed in your throat during surgery. If this causes discomfort, gargle with warm salt water. The discomfort should disappear within 24 hours.  If you had a scopolamine patch placed behind your ear for the management of post- operative nausea and/or vomiting:  1. The medication in the patch is effective for 72 hours, after which it should be removed.  Wrap patch in a tissue and discard in the trash. Wash hands thoroughly with soap and water. 2. You may remove the patch earlier than 72 hours if you experience unpleasant side effects which may include dry mouth, dizziness or visual disturbances. 3. Avoid touching the patch. Wash your hands with soap and water after contact with the patch.    Post Anesthesia Home Care Instructions  Activity: Get plenty of rest for the remainder of the day. A responsible individual must stay with you for 24 hours following the procedure.  For the next 24 hours, DO NOT: -Drive a car -Operate machinery -Drink alcoholic beverages -Take any medication unless instructed by your physician -Make any legal decisions or sign important papers.  Meals: Start with liquid foods such as gelatin or soup. Progress to  regular foods as tolerated. Avoid greasy, spicy, heavy foods. If nausea and/or vomiting occur, drink only clear liquids until the nausea and/or vomiting subsides. Call your physician if vomiting continues.  Special Instructions/Symptoms: Your throat may feel dry or sore from the anesthesia or the breathing tube placed in your throat during surgery. If this causes discomfort, gargle with warm salt water. The discomfort should disappear within 24 hours.  If you had a scopolamine patch placed behind your ear for the management of post- operative nausea and/or vomiting:  1. The medication in the patch is effective for 72 hours, after which it should be removed.  Wrap patch in a tissue and discard in the trash. Wash hands thoroughly with soap and water. 2. You may remove the patch earlier than 72 hours if you experience unpleasant side effects which may include dry mouth, dizziness or visual disturbances. 3. Avoid touching the patch. Wash your hands with soap and water after contact with the patch.    

## 2019-05-27 ENCOUNTER — Encounter (HOSPITAL_BASED_OUTPATIENT_CLINIC_OR_DEPARTMENT_OTHER): Payer: Self-pay | Admitting: General Surgery

## 2019-05-30 ENCOUNTER — Other Ambulatory Visit: Payer: Self-pay | Admitting: Oncology

## 2019-06-03 ENCOUNTER — Ambulatory Visit
Admission: RE | Admit: 2019-06-03 | Discharge: 2019-06-03 | Disposition: A | Discharge status: Home / Self Care | Payer: Managed Care, Other (non HMO) | Source: Ambulatory Visit | Attending: Family Medicine | Admitting: Family Medicine

## 2019-06-03 ENCOUNTER — Encounter: Payer: Self-pay | Admitting: Oncology

## 2019-06-03 DIAGNOSIS — E041 Nontoxic single thyroid nodule: Secondary | ICD-10-CM

## 2019-08-18 ENCOUNTER — Other Ambulatory Visit: Payer: Self-pay

## 2019-08-18 ENCOUNTER — Ambulatory Visit
Admission: RE | Admit: 2019-08-18 | Discharge: 2019-08-18 | Disposition: A | Discharge status: Home / Self Care | Payer: Managed Care, Other (non HMO) | Source: Ambulatory Visit | Attending: Oncology | Admitting: Oncology

## 2019-08-18 DIAGNOSIS — N6092 Unspecified benign mammary dysplasia of left breast: Secondary | ICD-10-CM

## 2019-08-18 DIAGNOSIS — Z9189 Other specified personal risk factors, not elsewhere classified: Secondary | ICD-10-CM

## 2019-08-18 MED ORDER — GADOBUTROL 1 MMOL/ML IV SOLN
6.0000 mL | Freq: Once | INTRAVENOUS | Status: AC | PRN
  Administered 2019-08-18: 6 mL via INTRAVENOUS

## 2019-08-23 ENCOUNTER — Other Ambulatory Visit: Payer: Self-pay | Admitting: Oncology

## 2019-08-23 ENCOUNTER — Ambulatory Visit: Payer: Managed Care, Other (non HMO) | Admitting: Oncology

## 2019-08-23 DIAGNOSIS — N6092 Unspecified benign mammary dysplasia of left breast: Secondary | ICD-10-CM

## 2019-08-23 HISTORY — PX: BREAST BIOPSY: SHX20

## 2019-08-24 ENCOUNTER — Encounter: Payer: Self-pay | Admitting: Oncology

## 2019-08-24 ENCOUNTER — Other Ambulatory Visit: Payer: Self-pay | Admitting: Oncology

## 2019-08-25 ENCOUNTER — Other Ambulatory Visit: Payer: Self-pay | Admitting: Oncology

## 2019-08-25 NOTE — Progress Notes (Unsigned)
I called Lindsay Black and apologize for the delay in calling her back with the MRI results.  She is set up for biopsy under ultrasound next week of the 6 mm lesion noted in the right breast.  She understands and I think this is going to be benign but he does need to be improved.  I will call her with the biopsy results as soon as I get  Incidentally her father died on 09/08/2019 from throat cancer.  He has been diagnosed only 3 months before.

## 2019-08-30 ENCOUNTER — Ambulatory Visit
Admission: RE | Admit: 2019-08-30 | Discharge: 2019-08-30 | Disposition: A | Discharge status: Home / Self Care | Payer: Managed Care, Other (non HMO) | Source: Ambulatory Visit | Attending: Oncology | Admitting: Oncology

## 2019-08-30 ENCOUNTER — Other Ambulatory Visit: Payer: Self-pay

## 2019-08-30 ENCOUNTER — Other Ambulatory Visit: Payer: Self-pay | Admitting: Oncology

## 2019-08-30 DIAGNOSIS — N6092 Unspecified benign mammary dysplasia of left breast: Secondary | ICD-10-CM

## 2019-08-31 ENCOUNTER — Inpatient Hospital Stay: Payer: Managed Care, Other (non HMO) | Admitting: Oncology

## 2019-09-01 ENCOUNTER — Other Ambulatory Visit: Payer: Self-pay | Admitting: Oncology

## 2019-09-01 DIAGNOSIS — Z1231 Encounter for screening mammogram for malignant neoplasm of breast: Secondary | ICD-10-CM

## 2019-09-01 NOTE — Progress Notes (Signed)
I called Lindsay Black and discussed her benign pathology.  She does not need to come see me next week to discuss.  She went off birth control pills 3 months ago and has not had a period.  If she does not have a menstrual period by November she will call and I will obtain labs to question her ovaries.  She will have her next mammogram in February and she will have a breast MRI in August.  She will return to see me a year from now  She knows to call for any other issues that may develop before then.

## 2019-09-05 ENCOUNTER — Telehealth: Payer: Self-pay | Admitting: Oncology

## 2019-09-05 NOTE — Telephone Encounter (Signed)
Scheduled appt per 9/10 sch message - pt aware of new appts date and time

## 2019-09-06 ENCOUNTER — Other Ambulatory Visit: Payer: Self-pay | Admitting: Oncology

## 2019-09-06 ENCOUNTER — Inpatient Hospital Stay: Payer: Managed Care, Other (non HMO) | Admitting: Oncology

## 2019-09-07 ENCOUNTER — Telehealth: Payer: Self-pay | Admitting: Adult Health

## 2019-09-07 NOTE — Telephone Encounter (Signed)
Called to request peer to peer for MRI of breasts. I did not have the case ID, insurance ID, or J code readily available, so it took the support team additional time to look it up.  After 15 minutes, I was scheduled to have peer to peer with Dr. Otelia Santee tomorrow morning at 0915.  Wilber Bihari, NP

## 2019-09-13 ENCOUNTER — Telehealth: Payer: Self-pay | Admitting: Genetic Counselor

## 2019-09-13 NOTE — Telephone Encounter (Signed)
Called patient regarding upcoming Webex appointment, per patient's request this will be a walk-in visit. °

## 2019-09-14 ENCOUNTER — Encounter: Payer: Self-pay | Admitting: Genetic Counselor

## 2019-09-14 ENCOUNTER — Other Ambulatory Visit: Payer: Self-pay

## 2019-09-14 ENCOUNTER — Inpatient Hospital Stay: Payer: Managed Care, Other (non HMO)

## 2019-09-14 ENCOUNTER — Inpatient Hospital Stay: Payer: Managed Care, Other (non HMO) | Attending: Genetic Counselor | Admitting: Genetic Counselor

## 2019-09-14 ENCOUNTER — Other Ambulatory Visit: Payer: Self-pay | Admitting: Genetic Counselor

## 2019-09-14 DIAGNOSIS — Z9189 Other specified personal risk factors, not elsewhere classified: Secondary | ICD-10-CM

## 2019-09-14 DIAGNOSIS — N6092 Unspecified benign mammary dysplasia of left breast: Secondary | ICD-10-CM

## 2019-09-14 DIAGNOSIS — Z803 Family history of malignant neoplasm of breast: Secondary | ICD-10-CM | POA: Insufficient documentation

## 2019-09-14 DIAGNOSIS — Z800 Family history of malignant neoplasm of digestive organs: Secondary | ICD-10-CM | POA: Diagnosis not present

## 2019-09-14 NOTE — Progress Notes (Signed)
REFERRING PROVIDER: Chauncey Cruel, MD [address] [place],  [place] [address]  PRIMARY PROVIDER:  Vernie Shanks, MD  PRIMARY REASON FOR VISIT:  1. Atypical ductal hyperplasia of left breast   2. Family history of breast cancer   3. Family history of colon cancer      HISTORY OF PRESENT ILLNESS:   [name], a 48 y.o. female, was seen for a [place] cancer genetics consultation at the request of Dr. Jana Hakim due to a family history of breast and colon cancer.  Lindsay Black presents to clinic today to discuss the possibility of a hereditary predisposition to cancer, genetic testing, and to further clarify her future cancer risks, as well as potential cancer risks for family members.   Lindsay Black is a 48 y.o. female with no personal history of cancer.  She was found to have ADH of her left breast and had a lumpectomy.    CANCER HISTORY:  Oncology History   No history exists.     RISK FACTORS:  Menarche was at age 41.  First live birth at age 43.  OCP use for approximately 27 years.  Ovaries intact: yes.  Hysterectomy: no.  Menopausal status: perimenopausal.  HRT use: 0 years. Colonoscopy: yes; normal. Mammogram within the last year: yes. Number of breast biopsies: 2. Up to date with pelvic exams: yes. Any excessive radiation exposure in the past: no  Past Medical History:  Diagnosis Date  . Anxiety   . Family history of breast cancer   . Family history of colon cancer     Past Surgical History:  Procedure Laterality Date  . BREAST LUMPECTOMY WITH RADIOACTIVE SEED LOCALIZATION Left 05/26/2019   Procedure: LEFT BREAST LUMPECTOMY WITH RADIOACTIVE SEED LOCALIZATION;  Surgeon: Jovita Kussmaul, MD;  Location: [facility];  Service: General;  Laterality: Left;  . CESAREAN SECTION    . WISDOM TOOTH EXTRACTION      Social History   Socioeconomic History  . Marital status: Married    Spouse name: Not on file  . Number of children: Not on file   . Years of education: Not on file  . Highest education level: Not on file  Occupational History  . Not on file  Social Needs  . Financial resource strain: Not on file  . Food insecurity    Worry: Not on file    Inability: Not on file  . Transportation needs    Medical: Not on file    Non-medical: Not on file  Tobacco Use  . Smoking status: Never Smoker  . Smokeless tobacco: Never Used  Substance and Sexual Activity  . Alcohol use: Yes    Frequency: Never    Comment: occas  . Drug use: Never  . Sexual activity: Not on file    Comment: husband has had vasectomy  Lifestyle  . Physical activity    Days per week: Not on file    Minutes per session: Not on file  . Stress: Not on file  Relationships  . Social Herbalist on phone: Not on file    Gets together: Not on file    Attends religious service: Not on file    Active member of club or organization: Not on file    Attends meetings of clubs or organizations: Not on file    Relationship status: Not on file  Other Topics Concern  . Not on file  Social History Narrative  . Not on file  FAMILY HISTORY:  We obtained a detailed, 4-generation family history.  Significant diagnoses are listed below: Family History  Problem Relation Age of Onset  . Prostate cancer Father        brachytherapy  . Throat cancer Father        d. 76  . Colon cancer Maternal Uncle        dx mid 39s  . Breast cancer Maternal Grandmother        dx early 17s; d. 95  . Colon cancer Maternal Grandfather 30       d. 45  . Dementia Paternal Grandmother   . Throat cancer Paternal Grandfather        d. 53s  . Metabolic syndrome Daughter        methylmalonic aciduria    The patient has two daughters who are cancer free, however one has a diagnosis of methylmalonic aciduria.  She has one sister who is cancer free.  Her mother is living and her father is deceased.  The patient's father had prostate cancer in his 41's and was diagnosed  with throat cancer at 68.  He died in August 20, 2019.  He was an only child.  His mother died from dementia complications and his father died of throat cancer.  The patient's mother is living at 14.  She has two sisters and two brothers.  One brother had colon cancer in his mid 51's.  Her maternal grandmother was diagnosed with breast cancer in her early 7's and her grandfather was diagnosed with colon cancer in his mid 28's.  Lindsay Black is unaware of previous family history of genetic testing for hereditary cancer risks. Patient's maternal ancestors are of Caucasian descent, and paternal ancestors are of Caucasian descent. There is no reported Ashkenazi Jewish ancestry. There is no known consanguinity.  GENETIC COUNSELING ASSESSMENT: Lindsay Black is a 48 y.o. female with a family history of cancer which is somewhat suggestive of a familial predisposition to cancer given the older ages of onset of cancer. We, therefore, discussed and recommended the following at today's visit.   DISCUSSION: We discussed that 5 - 10% of cancer is hereditary.  Depending on the cancer type, the risk for a hereditary cancer will differ.  For instance, about 5-10% of breast cancer, 5-7% of colon cancer and up to 15% of prostate cancer is hereditary.  We discussed with Lindsay Black that the family history does not meet insurance or NCCN criteria for genetic testing and, therefore, is not highly consistent with a familial hereditary cancer syndrome.  We feel she is at low risk to harbor a gene mutation associated with such a condition.   We disucssed that genetic testing could still be performed but there would be a $250 out of pocket cost.  The patient works at [employer], and can get testing performed for free.  We discussed that we don't typically order from [facility] due to other laboratories having more experience and a larger share of the testing and therefore have seen more of the variants.  She understands, but would like to  pursue genetic testing through [employer], and can do this through her provider.  We discussed the potential outcomes of testing including a positive, negative, carrier or variant of uncertain significance.    PLAN:  Ms. Vause did not wish to pursue genetic testing at today's visit, but instead will reach out to her primary care provider. We understand this decision and remain available to discuss genetic testing  results with her once they come back. We, therefore, recommend Ms. Stumpp continue to follow the cancer screening guidelines given by her primary healthcare provider.  Lastly, we encouraged Ms. Galeas to remain in contact with cancer genetics annually so that we can continuously update the family history and inform her of any changes in cancer genetics and testing that may be of benefit for this family.   Ms. Madding questions were answered to her satisfaction today. Our contact information was provided should additional questions or concerns arise. Thank you for the referral and allowing [place] to share in the care of your patient.    P. Lowell Guitar, MS, [facility] Licensed, Patent attorney [email or web address] phone: [phone]  The patient was seen for a total of 30 minutes in face-to-face genetic counseling.  This patient was discussed with Drs. Magrinat, Pamelia Hoit and/or Mosetta Putt who agrees with the above.    _______________________________________________________________________ For Office Staff:  Number of people involved in session: 1 Was an Intern/ student involved with case: no

## 2019-10-13 ENCOUNTER — Other Ambulatory Visit: Payer: Self-pay

## 2019-10-13 DIAGNOSIS — Z20822 Contact with and (suspected) exposure to covid-19: Secondary | ICD-10-CM

## 2019-10-15 LAB — NOVEL CORONAVIRUS, NAA: SARS-CoV-2, NAA: NOT DETECTED

## 2019-12-17 IMAGING — MG MM CLIP PLACEMENT
6 series · 6 of 14 positions shown · non-contrast
Comparison: Previous exam(s).

CLINICAL DATA: Status post stereotactic core needle biopsy of an
architectural distortion in the left breast.

EXAM:
DIAGNOSTIC LEFT MAMMOGRAM POST STEREOTACTIC BIOPSY

[L CC]
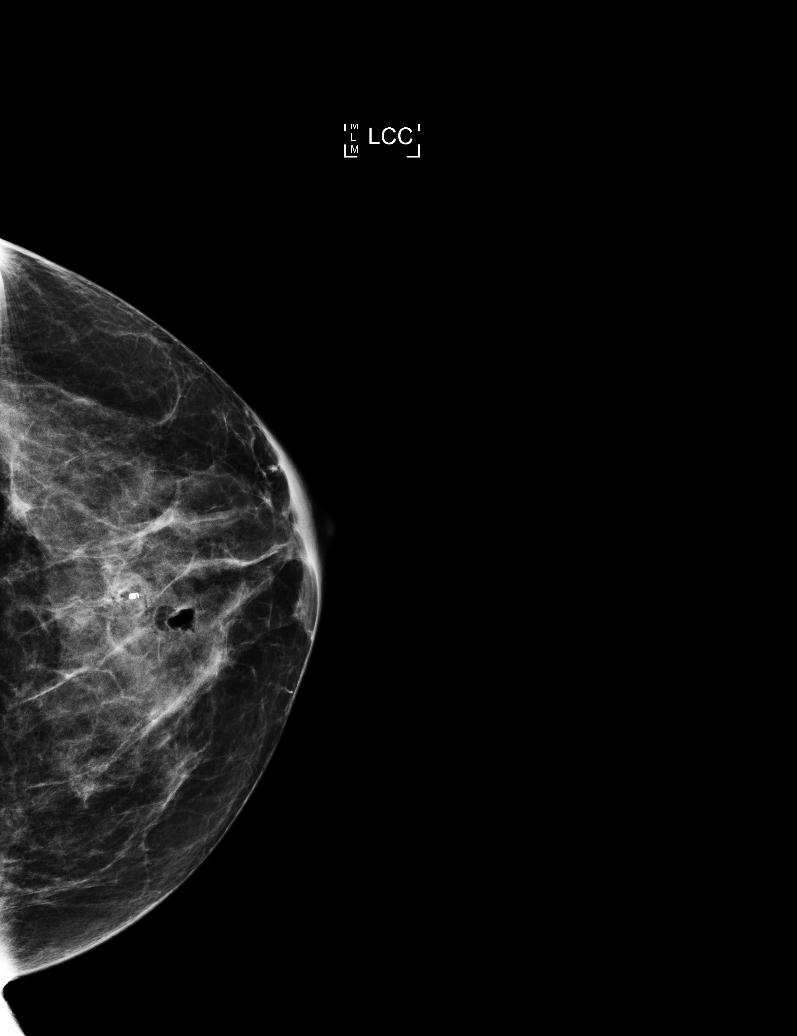

[L CC synth-2D]
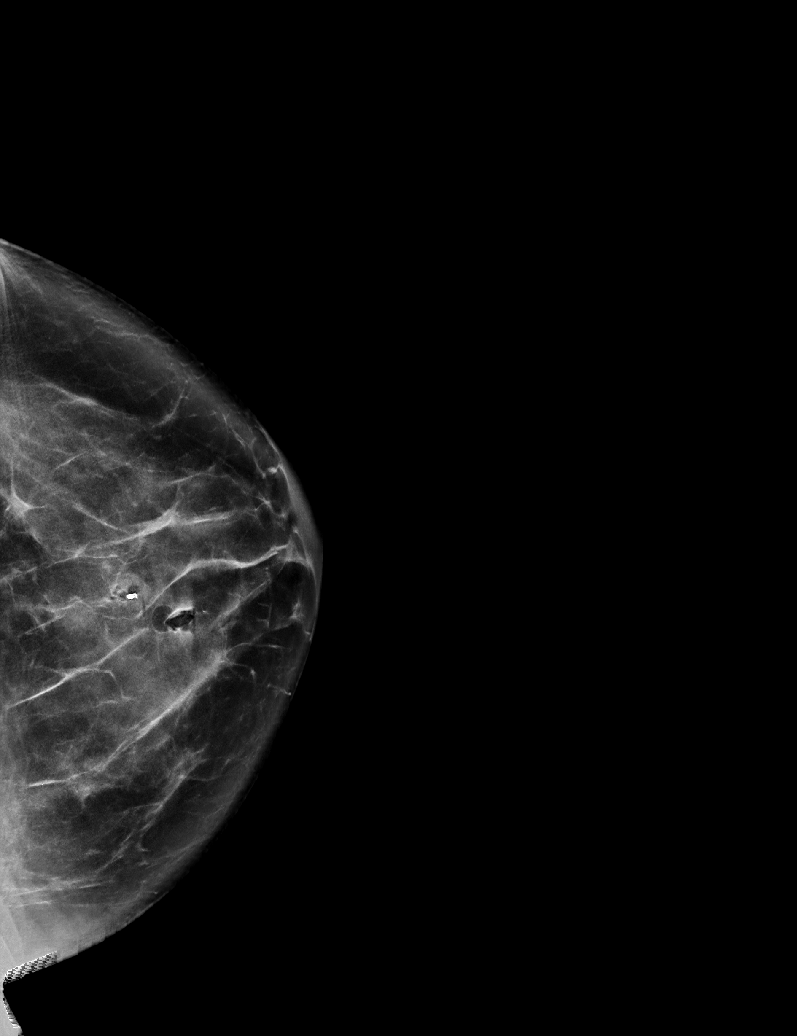

[L ML synth-2D]
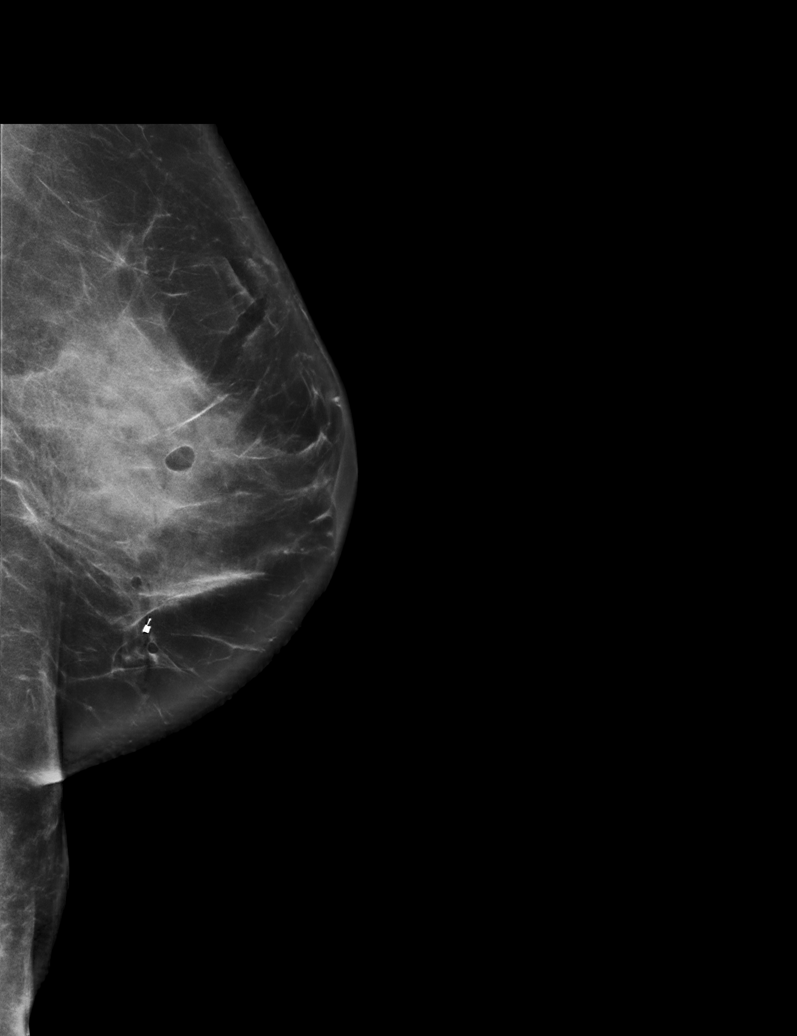

[L ML]
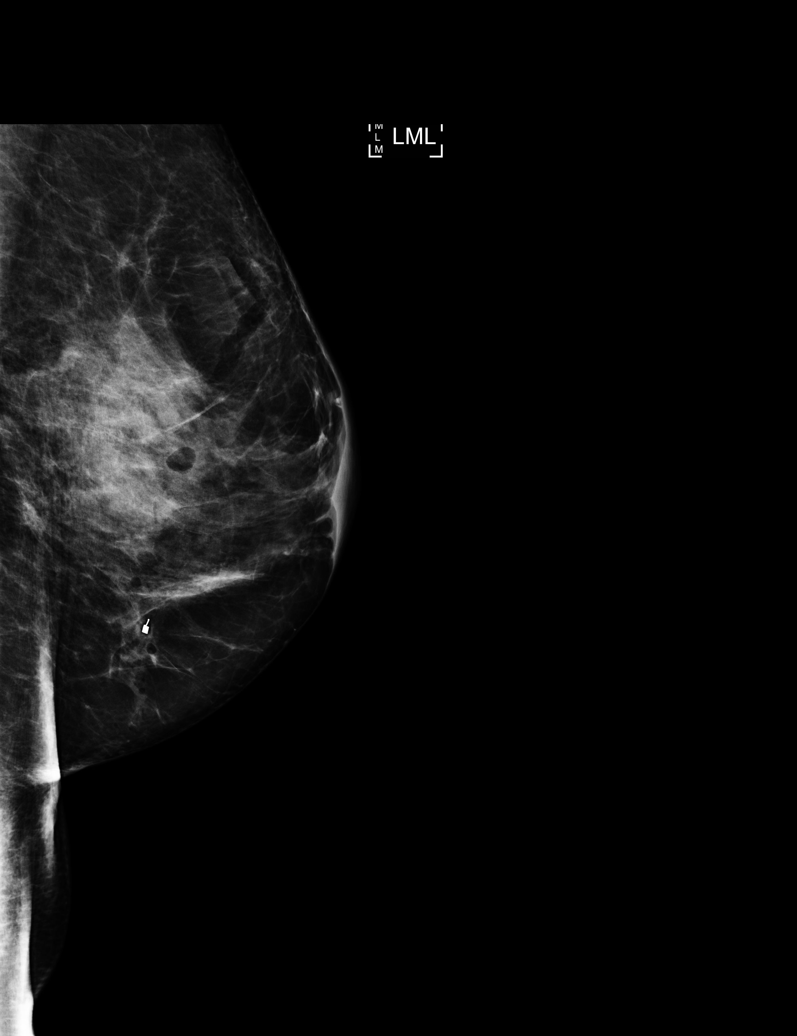

[L CC tomo · tomo slice 37/74.0]
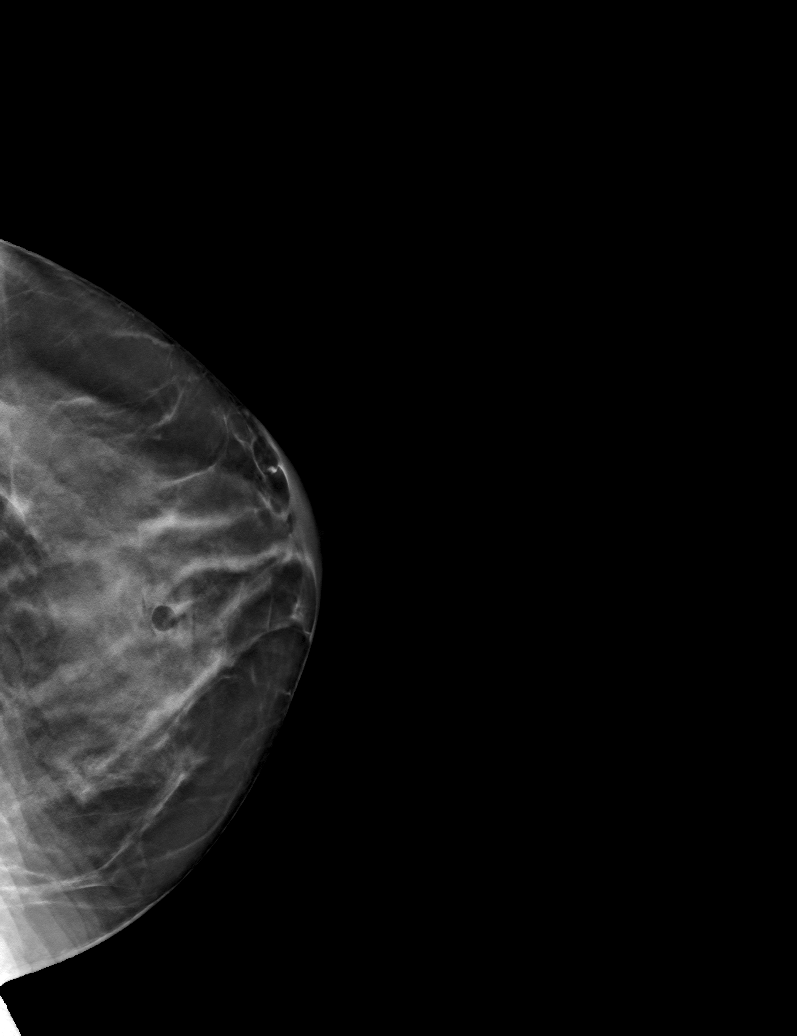

[L ML tomo · tomo slice 44/87.0]
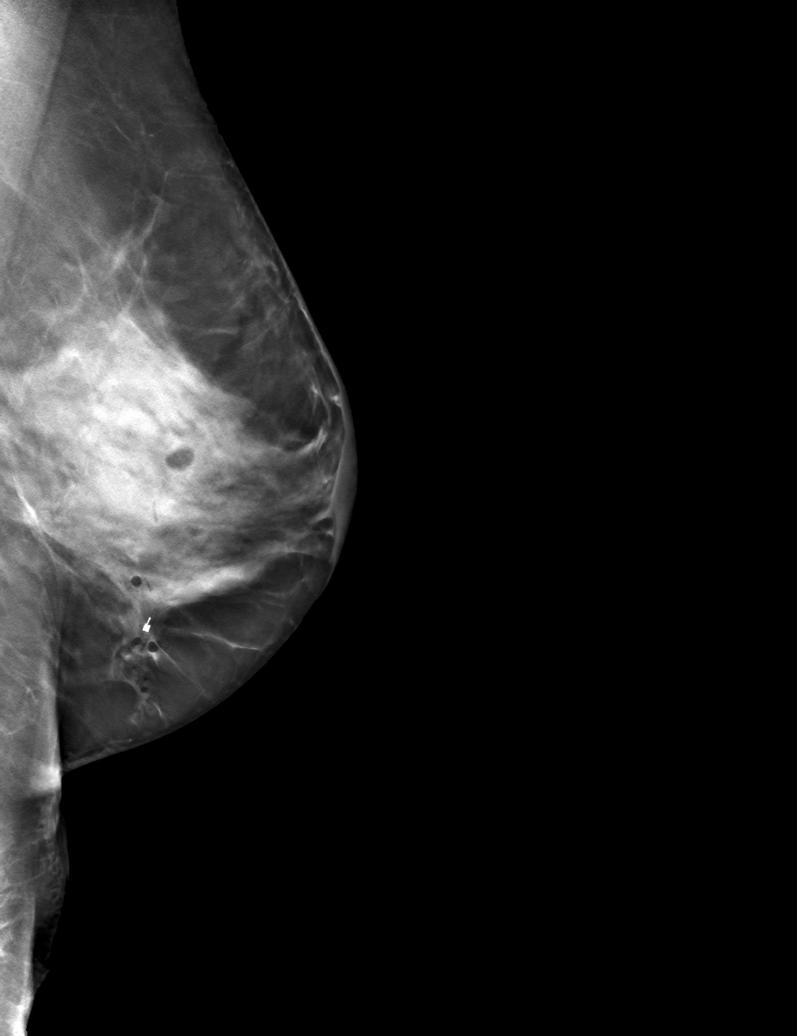

[6 of 14 positions shown; findings below may reference images not displayed]

FINDINGS: Mammographic images were obtained following stereotactic guided
biopsy of left breast architectural distortion. The biopsy cavity
extends through the area architectural distortion. Coil shaped
biopsy clip is mildly displaced inferiorly, by 8-9 mm, and
anteriorly by 3-4 mm.
IMPRESSION: Post clip images following stereotactic core needle biopsy of the
left breast. Clip mildly displaced from the biopsy cavity as
detailed above.

Final Assessment: Post Procedure Mammograms for Marker Placement

## 2019-12-22 ENCOUNTER — Other Ambulatory Visit: Payer: Self-pay | Admitting: Internal Medicine

## 2019-12-22 DIAGNOSIS — R6889 Other general symptoms and signs: Secondary | ICD-10-CM

## 2019-12-27 ENCOUNTER — Other Ambulatory Visit: Payer: Self-pay | Admitting: Internal Medicine

## 2019-12-28 ENCOUNTER — Other Ambulatory Visit: Payer: Self-pay | Admitting: Internal Medicine

## 2019-12-28 DIAGNOSIS — E041 Nontoxic single thyroid nodule: Secondary | ICD-10-CM

## 2020-01-02 ENCOUNTER — Other Ambulatory Visit: Payer: Managed Care, Other (non HMO)

## 2020-02-27 ENCOUNTER — Ambulatory Visit
Admission: RE | Admit: 2020-02-27 | Discharge: 2020-02-27 | Disposition: A | Discharge status: Home / Self Care | Payer: Managed Care, Other (non HMO) | Source: Ambulatory Visit | Attending: Oncology | Admitting: Oncology

## 2020-02-27 ENCOUNTER — Other Ambulatory Visit: Payer: Self-pay

## 2020-02-27 DIAGNOSIS — Z1231 Encounter for screening mammogram for malignant neoplasm of breast: Secondary | ICD-10-CM

## 2020-03-24 ENCOUNTER — Ambulatory Visit: Payer: Managed Care, Other (non HMO) | Attending: Internal Medicine

## 2020-03-24 DIAGNOSIS — Z23 Encounter for immunization: Secondary | ICD-10-CM

## 2020-03-24 NOTE — Progress Notes (Signed)
   Covid-19 Vaccination Clinic  Name:  Lindsay Black    MRN: [id number] DOB: Sep 23, 1971  03/24/2020  [name] was observed post Covid-19 immunization for 15 minutes without incident. She was provided with Vaccine Information Sheet and instruction to access the V-Safe system.   [name] was instructed to call 911 with any severe reactions post vaccine: Marland Kitchen Difficulty breathing  . Swelling of face and throat  . A fast heartbeat  . A bad rash all over body  . Dizziness and weakness   Immunizations Administered    Name Date Dose VIS Date Route   Pfizer COVID-19 Vaccine 03/24/2020 11:46 AM 0.3 mL 12/02/2019 Intramuscular   Manufacturer: [employer], [employer]   Lot: [id number]   NDC: [address]-7

## 2020-04-18 ENCOUNTER — Ambulatory Visit: Payer: Managed Care, Other (non HMO) | Attending: Internal Medicine

## 2020-04-18 DIAGNOSIS — Z23 Encounter for immunization: Secondary | ICD-10-CM

## 2020-04-18 NOTE — Progress Notes (Signed)
   Covid-19 Vaccination Clinic  Name:  RENDA POHLMAN    MRN: [id number] DOB: [birth year]  04/18/2020  Ms. Seigler was observed post Covid-19 immunization for 15 minutes without incident. She was provided with Vaccine Information Sheet and instruction to access the V-Safe system.   Ms. Filyaw was instructed to call 911 with any severe reactions post vaccine: Marland Kitchen Difficulty breathing  . Swelling of face and throat  . A fast heartbeat  . A bad rash all over body  . Dizziness and weakness   Immunizations Administered    Name Date Dose VIS Date Route   Pfizer COVID-19 Vaccine 04/18/2020  4:05 PM 0.3 mL 02/15/2019 Intramuscular   Manufacturer: [employer], [employer]   Lot: [id number]   NDC: [address]-6

## 2020-04-23 IMAGING — US US THYROID
1 series · 13 of 25 positions shown · non-contrast
Comparison: 04/15/2017

CLINICAL DATA: Thyroid nodules

EXAM:
THYROID ULTRASOUND
TECHNIQUE: Ultrasound examination of the thyroid gland and adjacent soft
tissues was performed.

[Series 1: us thyroid · 0.05mm/px · 54 acquisitions, 13 frames shown]
[im 1/54]
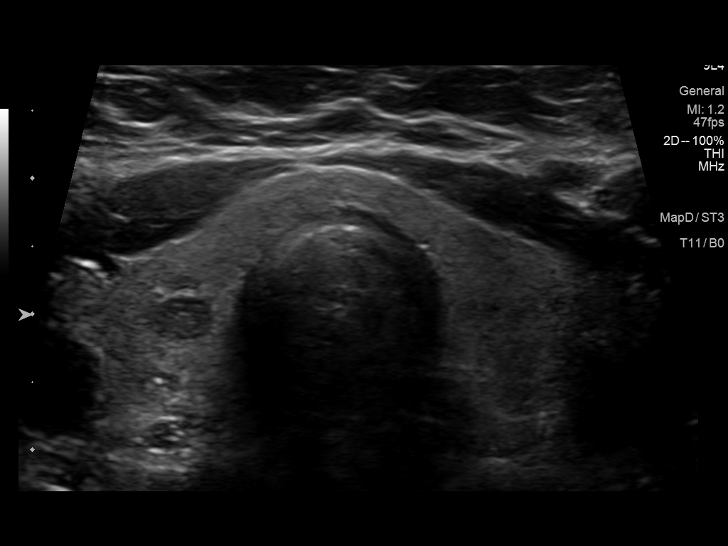
[im 5/54]
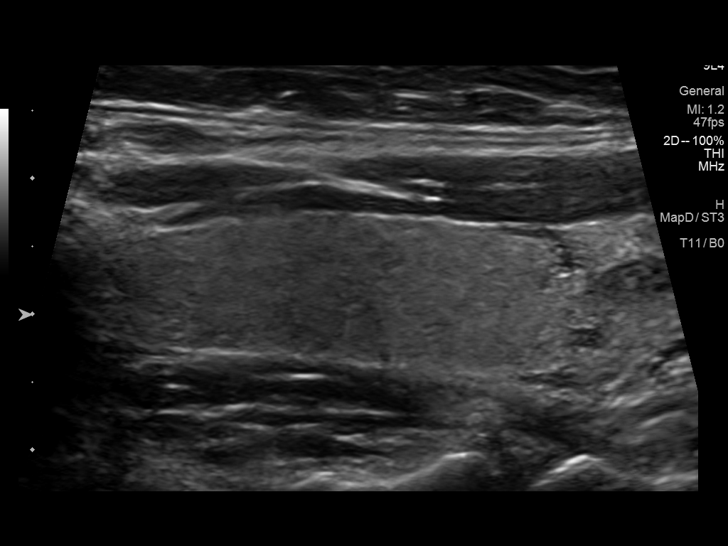
[im 9/54]
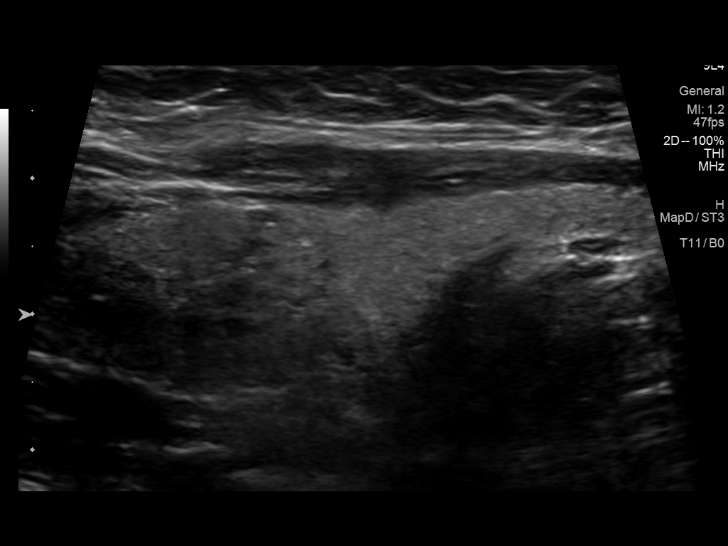
[im 14/54]
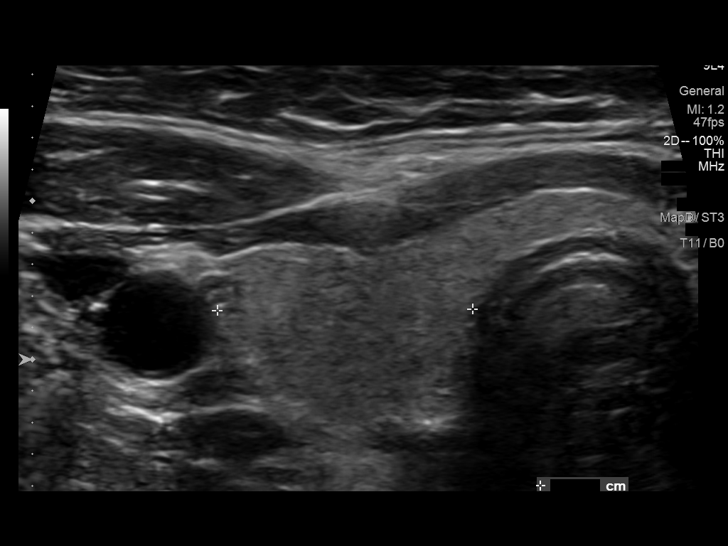
[im 18/54]
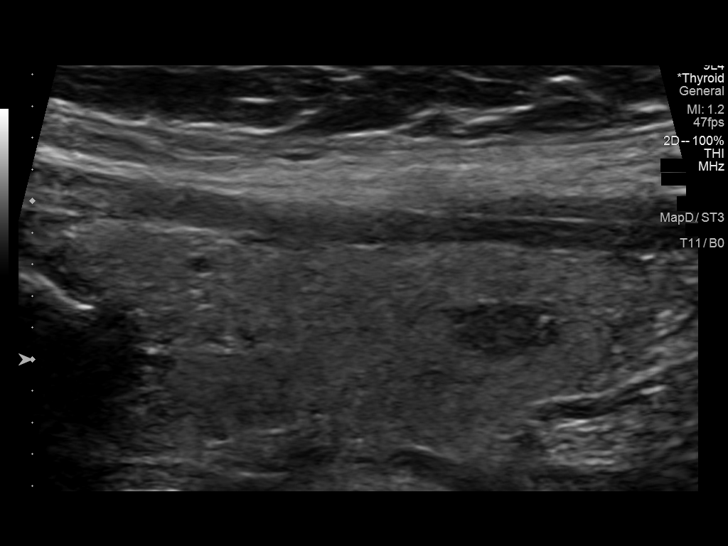
[im 23/54]
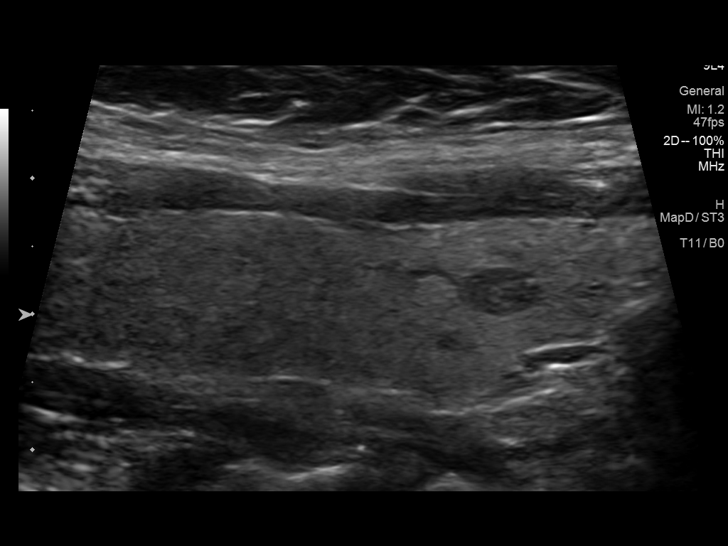
[im 27/54]
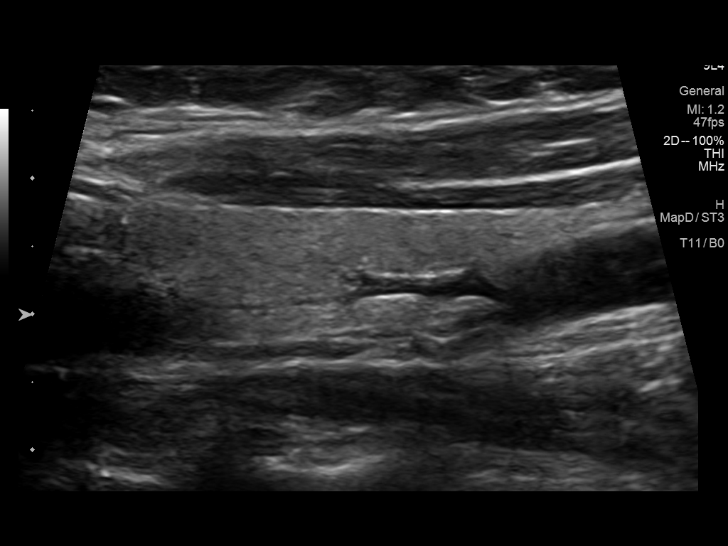
[im 31/54]
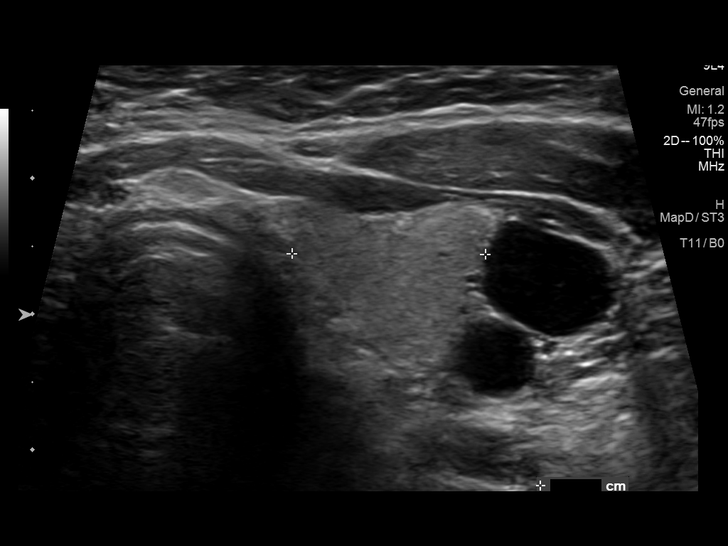
[im 36/54]
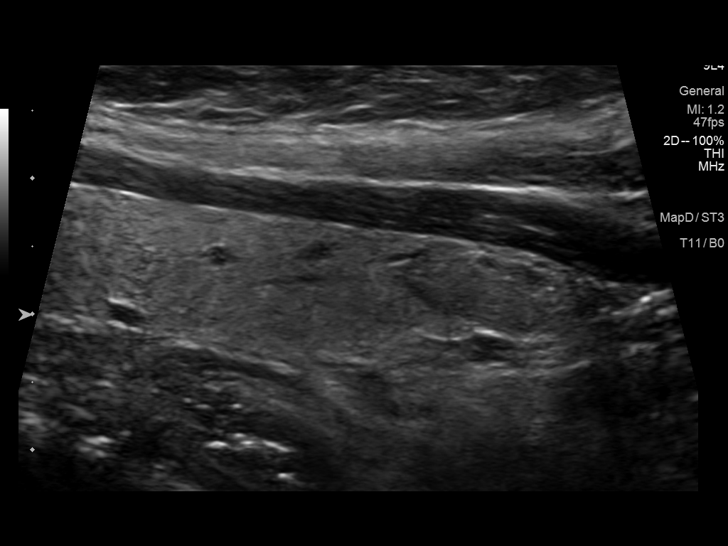
[im 40/54]
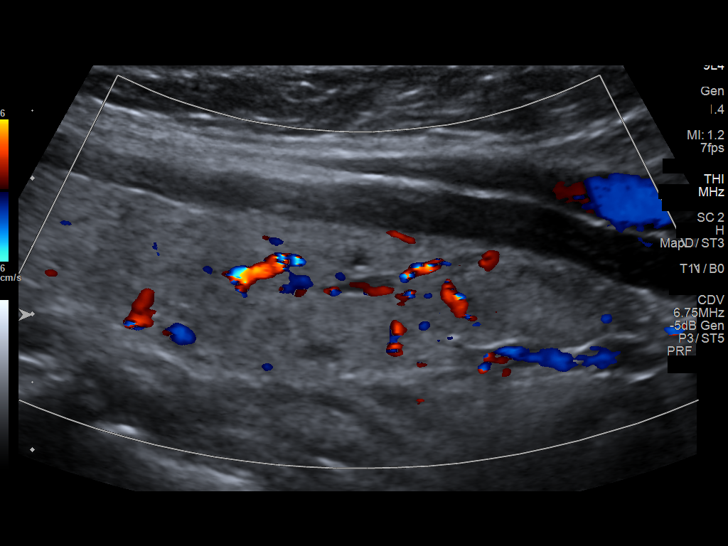
[im 45/54]
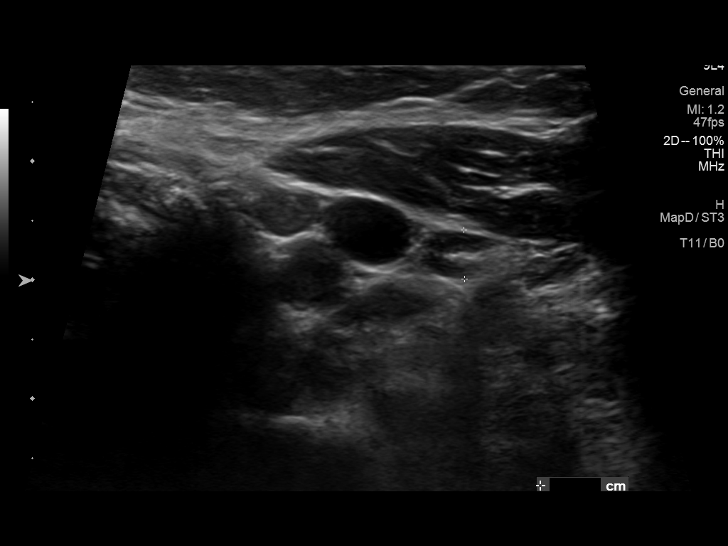
[im 49/54]
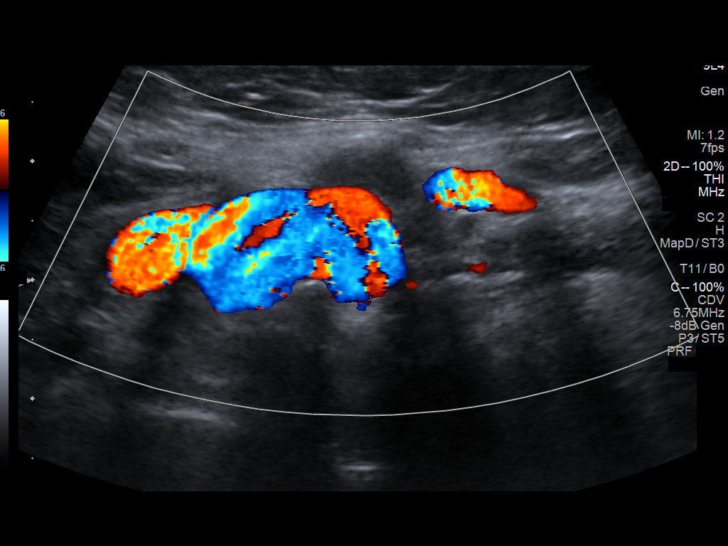
[im 54/54]
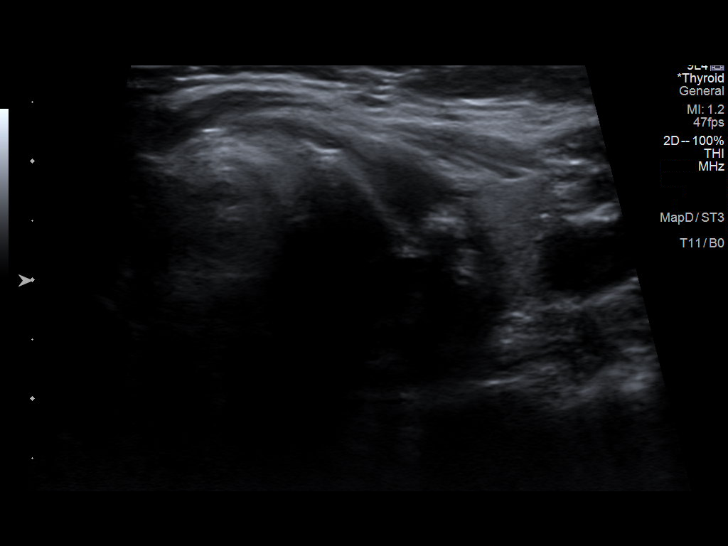

[13 of 25 positions shown; findings below may reference images not displayed]

FINDINGS: Parenchymal Echotexture: Mildly heterogenous

Isthmus: 3 mm

Right lobe: 4.7 x 1.5 x 1.6 cm, previously 4.8 x 1.6 x 1.7 cm

Left lobe: 4.7 x 1.1 x 1.4 cm, previously 4.9 x 1.3 x 1.4 cm

_________________________________________________________

Estimated total number of nodules >/= 1 cm: 1

Number of spongiform nodules >/=  2 cm not described below (TR1): 0

Number of mixed cystic and solid nodules >/= 1.5 cm not described
below (TR2): 0

_________________________________________________________

Nodule # 2:

Location: Left; Inferior

Maximum size: 1.3, previously 1.2 cm; Other 2 dimensions: 1.0 x
cm

Composition: solid/almost completely solid (2)

Echogenicity: isoechoic (1)

Shape: not taller-than-wide (0)

Margins: ill-defined (0)

Echogenic foci: none (0)

ACR TI-RADS total points: 3.

ACR TI-RADS risk category: TR3 (3 points).

ACR TI-RADS recommendations:

Given size (<1.4 cm) and appearance, this nodule does NOT meet
TI-RADS criteria for biopsy or dedicated follow-up.

_________________________________________________________

There is an additional subcentimeter right inferior thyroid
hypoechoic 7 mm nodule noted. This would not meet criteria for any
biopsy or follow-up as well.

Normal vascularity. No regional adenopathy. Benign cervical lymph
nodes noted.
IMPRESSION: 1.3 cm left inferior TR 3 nodule has not significantly changed and
does not meet criteria for any biopsy or follow-up.

The above is in keeping with the ACR TI-RADS recommendations - [HOSPITAL] 1366;[DATE].

## 2020-08-21 ENCOUNTER — Ambulatory Visit
Admission: RE | Admit: 2020-08-21 | Discharge: 2020-08-21 | Disposition: A | Discharge status: Home / Self Care | Payer: Managed Care, Other (non HMO) | Source: Ambulatory Visit | Attending: Oncology | Admitting: Oncology

## 2020-08-21 DIAGNOSIS — Z1231 Encounter for screening mammogram for malignant neoplasm of breast: Secondary | ICD-10-CM

## 2020-08-21 MED ORDER — GADOBUTROL 1 MMOL/ML IV SOLN
6.0000 mL | Freq: Once | INTRAVENOUS | Status: AC | PRN
  Administered 2020-08-21: 6 mL via INTRAVENOUS

## 2020-09-10 ENCOUNTER — Telehealth: Payer: Self-pay | Admitting: Oncology

## 2020-09-10 NOTE — Telephone Encounter (Signed)
Updated appointment per 9/20 scheduling message. Called patient, who is aware of appointment update.

## 2020-09-13 ENCOUNTER — Inpatient Hospital Stay: Payer: Managed Care, Other (non HMO) | Attending: Oncology | Admitting: Oncology

## 2020-09-13 ENCOUNTER — Telehealth: Payer: Managed Care, Other (non HMO) | Admitting: Oncology

## 2020-09-13 ENCOUNTER — Inpatient Hospital Stay: Payer: Managed Care, Other (non HMO) | Admitting: Oncology

## 2020-09-13 ENCOUNTER — Ambulatory Visit: Payer: Managed Care, Other (non HMO) | Admitting: Oncology

## 2020-09-13 DIAGNOSIS — Z9189 Other specified personal risk factors, not elsewhere classified: Secondary | ICD-10-CM

## 2020-09-13 MED ORDER — VENLAFAXINE HCL ER 37.5 MG PO CP24: 37.5000 mg | ORAL_CAPSULE | Freq: Every day | ORAL | 4 refills | Status: DC

## 2020-09-13 NOTE — Progress Notes (Signed)
[facility]  Telephone:(336) [phone] Fax:(336) [phone]     ID: Lindsay Black DOB: [birth year]  MR#: [id number]  ZHY#:[id number]  Patient Care Team: Lindsay Ladd, MD as PCP - General (Family Medicine) Lindsay Miner, MD as Consulting Physician (General Surgery) Lindsay Black, Lindsay Hue, MD as Consulting Physician (Oncology) Lindsay Camp, MD as Consulting Physician (Obstetrics and Gynecology) Lindsay Level, MD as Consulting Physician (General Surgery) Lindsay Shiver, MD as Consulting Physician (Gastroenterology) Lindsay Dell, MD OTHER MD:  I connected with Lindsay Black on 09/13/20 at  1:00 PM EDT by telephone visit and verified that I am speaking with the correct person using two identifiers.   I discussed the limitations, risks, security and privacy concerns of performing an evaluation and management service by telemedicine and the availability of in-person appointments. I also discussed with the patient that there may be a patient responsible charge related to this service. The patient expressed understanding and agreed to proceed.   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 She was evaluated in the high risk clinic on 03/30/2019.  Since consultation, she underwent left lumpectomy on 05/25/2020 under Dr. Carolynne Black. Pathology from the procedure ([id number]) was benign.  She underwent breast MRI on 08/18/2019 showing an indeterminate right breast mass. She proceeded to biopsy on 08/30/2019 [phone]), which was benign.  She also underwent bilateral screening mammography with tomography at [facility] on 02/27/2020 showing: breast density category D; no evidence of malignancy in  either breast.   Finally, she also underwent breast MRI on 08/21/2020 showing: breast density C; no definite suspicious area of enhancement within either breast to suggest malignancy; multiple scattered foci of enhancement demonstrated within breast bilaterally.  A liver cyst was incidentally noted   REVIEW OF SYSTEMS: Lindsay Black walks about 30 minutes a day.  She does have 3 dogs to take care of.  She has had the Pfizer vaccine x2 with no complications.  She is "definitely in menopause" and is particularly having a lot of hot flashes and other issues.  Aside from that a detailed review of systems today was stable   HISTORY OF CURRENT ILLNESS: From the original intake note:  Lindsay Black had routine screening mammography on 01/17/2019 showing a possible abnormality in the left breast. She underwent bilateral diagnostic mammography with tomography and left breast ultrasonography at [facility] on 01/21/2019 showing: breast density category C; questionable, persistent distortion is seen on the CC projection in the central left breast; no suspicious left axillary lymphadenopathy.  Accordingly on 01/26/2019 she proceeded to biopsy of the left breast area in question. 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   Her definitive surgery is being postponed because of the current pandemic.  The patient's subsequent history is as detailed below.   PAST MEDICAL HISTORY: Past Medical History:  Diagnosis Date  . Anxiety   . Family history of breast cancer   . Family history of colon cancer   Thyroid nodules   PAST SURGICAL HISTORY: Past Surgical History:  Procedure Laterality Date  . BREAST BIOPSY Right 08/2019   [facility]  . BREAST EXCISIONAL BIOPSY Left 05/2019   Negative, atypical ductal hyperplasia  .  BREAST LUMPECTOMY WITH RADIOACTIVE SEED LOCALIZATION Left 05/26/2019   Procedure: LEFT BREAST LUMPECTOMY WITH RADIOACTIVE  SEED LOCALIZATION;  Surgeon: Lindsay Miner, MD;  Location: [facility];  Service: General;  Laterality: Left;  . CESAREAN SECTION    . WISDOM TOOTH EXTRACTION      FAMILY HISTORY Family History  Problem Relation Age of Onset  . Prostate cancer Father        brachytherapy  . Throat cancer Father        d. 65  . Colon cancer Maternal Uncle        dx mid 86s  . Breast cancer Maternal Grandmother        dx early 84s; d. 95  . Colon cancer Maternal Grandfather 49       d. 20  . Dementia Paternal Grandmother   . Throat cancer Paternal Grandfather        d. 22s  . Metabolic syndrome Daughter        methylmalonic aciduria   As of April 2020, patient's father is alive at 43 years old.  He has a history of prostate cancer.  Patient's mother is also alive at age 98. Maternal grandmother was diagnosed with breast cancer in her early 62s.  The patient has 1 sister, no brothers.    GYNECOLOGIC HISTORY:  No LMP recorded. Menarche: 49 years old Age at first live birth: 49 years old GX P 2 LMP within the last month Contraceptive currently on oral contraceptives HRT husband is status post vasectomy Hysterectomy? no BSO? 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 She is married. (Second) husband Lindsay Black is a Emergency planning/management officer for [employer]. Lindsay Black has two children from a previous marriage, ages 80 and 26, that live with them 50% of the time and with their mother the other 50%.  Lindsay Black's 2 children are Lindsay Black, 48 year old, who lives in [place] and works in Public relations account executive, and Lindsay Black, 50 year old, attending APP. at home also there are 4 dogs and 2 cats.  The patient is a International aid/development worker.    ADVANCED DIRECTIVES: Her sister, Lindsay Black, is her HCPOA.  She lives in [place] and can be reached at [phone]   HEALTH MAINTENANCE: Social History   Tobacco Use  . Smoking status: Never Smoker  . Smokeless tobacco: Never Used  Vaping  Use  . Vaping Use: Never used  Substance Use Topics  . Alcohol use: Yes    Comment: occas  . Drug use: Never     Colonoscopy: Jan 2020, repeat in 5 years  PAP: 01/17/2019, negative, Dr. Rana Snare  Bone density: Jan 2020, osteopenia   No Known Allergies  Current Outpatient Medications  Medication Sig Dispense Refill  . HYDROcodone-acetaminophen (NORCO/VICODIN) 5-325 MG tablet Take 1-2 tablets by mouth every 6 (six) hours as needed for moderate pain or severe pain. 15 tablet 0  . sertraline (ZOLOFT) 50 MG tablet Take 50 mg by mouth daily. Takes 1.5 tablets daily     No current facility-administered medications for this visit.    OBJECTIVE:   There were no vitals filed for this visit.   There is no height or weight on file to calculate BMI.   Wt Readings from Last 3 Encounters:  05/26/19 133 lb 6.1 oz (60.5 kg)  03/31/19 134 lb 12.8 oz (61.1 kg)      ECOG FS:0 - Asymptomatic  Telephone visit 09/13/2020   LAB RESULTS:  CMP  No  results found for: NA, K, CL, CO2, GLUCOSE, BUN, CREATININE, CALCIUM, PROT, ALBUMIN, AST, ALT, ALKPHOS, BILITOT, GFRNONAA, GFRAA  No results found for: TOTALPROTELP, ALBUMINELP, A1GS, A2GS, BETS, BETA2SER, GAMS, MSPIKE, SPEI  No results found for: KPAFRELGTCHN, LAMBDASER, KAPLAMBRATIO  No results found for: WBC, NEUTROABS, HGB, HCT, MCV, PLT  No results found for: LABCA2  No components found for: [id number]  No results for input(s): INR in the last 168 hours.  No results found for: LABCA2  No results found for: [id number]  No results found for: [id number]  No results found for: [id number]  No results found for: CA2729  No components found for: HGQUANT  No results found for: CEA1 / No results found for: CEA1   No results found for: AFPTUMOR  No results found for: CHROMOGRNA  No results found for: HGBA, HGBA2QUANT, HGBFQUANT, HGBSQUAN (Hemoglobinopathy evaluation)   No results found for: LDH  No results found for: IRON, TIBC, IRONPCTSAT (Iron  and TIBC)  No results found for: FERRITIN  Urinalysis No results found for: COLORURINE, APPEARANCEUR, LABSPEC, PHURINE, GLUCOSEU, HGBUR, BILIRUBINUR, KETONESUR, PROTEINUR, UROBILINOGEN, NITRITE, LEUKOCYTESUR   STUDIES: MR BREAST BILATERAL W WO CONTRAST INC CAD  Result Date: 08/21/2020 CLINICAL DATA:  Patient with history of excisional biopsy of the left breast demonstrating fibrocystic changes on final pathology. Additionally, patient has had ultrasound-guided biopsy of right breast mass with benign pathology. Patient with elevated lifetime risk of breast cancer. EXAM: BILATERAL BREAST MRI WITH AND WITHOUT CONTRAST TECHNIQUE: Multiplanar, multisequence MR images of both breasts were obtained prior to and following the intravenous administration of 6 ml of Gadavist Three-dimensional MR images were rendered by post-processing of the original MR data on an independent workstation. The three-dimensional MR images were interpreted, and findings are reported in the following complete MRI report for this study. Three dimensional images were evaluated at the independent interpreting workstation using the DynaCAD thin client. COMPARISON:  Previous exam(s). FINDINGS: Breast composition: c. Heterogeneous fibroglandular tissue. Background parenchymal enhancement: Mild Right breast: No mass or abnormal enhancement. Multiple scattered foci of enhancement within the right breast. Susceptibility artifact from biopsy marking clip within the right breast. Left breast: No mass or abnormal enhancement. Multiple scattered foci of enhancement within the left breast. Lymph nodes: No abnormal appearing lymph nodes. Ancillary findings: Incidental note is made of a 1.9 cm T2 bright nonenhancing mass within the left hepatic lobe most compatible with a cyst. IMPRESSION: No definite suspicious area of enhancement identified within the right or left breast to suggest malignancy. Multiple scattered foci of enhancement demonstrated  within the breast bilaterally. RECOMMENDATION: Annual high risk screening MRI. Annual mammography. BI-RADS CATEGORY  2: Benign. Electronically Signed   By: Annia Beltrew  Davis M.D.   On: 08/21/2020 11:52    ELIGIBLE FOR AVAILABLE RESEARCH PROTOCOL: no  ASSESSMENT: 49 y.o. [place] woman status post left breast biopsy 01/26/2019 showing atypical ductal hyperplasia  (1) definitive surgery pending  (2) opted against risk reduction with antiestrogens  (3) intensified screening with breast MRI August 2020 and yearly, in addition to yearly mammography   PLAN: Annabelle HarmanDana was very relieved with the results of her breast MRI.  I reassured her that liver cysts are common in generally benign.  We will take another look at it incidentally next year when she has the repeat MRI.  Of course she has mammography also 6months later, in between the MRIs.  She is now fully menopausal.  We discussed that that means not only hot flashes but weight gain insomnia thinning hair  thin bones and vaginal dryness among other changes.  She would like to do something about the hot flashes and we are going to go with venlafaxine 37.5 mg.  She is already on sertraline 50 mg daily.  I will not be able to increase the venlafaxine dose accordingly and therefore I am hoping this very low dose will work for her.  For the vaginal dryness as suggested coconut oil.  She is interested in estrogen and we certainly can do that if she starts tamoxifen.  That would help also in terms of bone density issues.  She tells me she will discuss that with her husband.  As far as diet is concerned and weight gain with menopause I suggested staying away from white bread potatoes pasta and rice.  She will have her next MRI in a year.  She will see me shortly after that.  I did ask her to call us if after a few weeks on the venlafaxine she has experienced no benefit.  I also offered her participation in our pelvic rehab program.   Lindsay Dell, MD    09/13/2020 1:06 PM Medical Oncology and Hematology [facility] [address] [place], [place] [address] Tel. [phone]    Fax. [phone]   This document serves as a record of services personally performed by Ruthann Cancer, MD. It was created on his behalf by Mickie Bail, a trained medical scribe. The creation of this record is based on the scribe's personal observations and the provider's statements to them.   I, Ruthann Cancer MD, have reviewed the above documentation for accuracy and completeness, and I agree with the above.

## 2021-01-15 ENCOUNTER — Other Ambulatory Visit: Payer: Self-pay | Admitting: Oncology

## 2021-01-15 DIAGNOSIS — Z1231 Encounter for screening mammogram for malignant neoplasm of breast: Secondary | ICD-10-CM

## 2021-02-27 ENCOUNTER — Other Ambulatory Visit: Payer: Self-pay

## 2021-02-27 ENCOUNTER — Ambulatory Visit
Admission: RE | Admit: 2021-02-27 | Discharge: 2021-02-27 | Disposition: A | Discharge status: Home / Self Care | Payer: Managed Care, Other (non HMO) | Source: Ambulatory Visit | Attending: Oncology | Admitting: Oncology

## 2021-02-27 DIAGNOSIS — Z1231 Encounter for screening mammogram for malignant neoplasm of breast: Secondary | ICD-10-CM

## 2021-08-22 ENCOUNTER — Ambulatory Visit
Admission: RE | Admit: 2021-08-22 | Discharge: 2021-08-22 | Disposition: A | Discharge status: Home / Self Care | Payer: Managed Care, Other (non HMO) | Source: Ambulatory Visit | Attending: Oncology | Admitting: Oncology

## 2021-08-22 DIAGNOSIS — Z9189 Other specified personal risk factors, not elsewhere classified: Secondary | ICD-10-CM

## 2021-08-22 MED ORDER — GADOBUTROL 1 MMOL/ML IV SOLN
6.0000 mL | Freq: Once | INTRAVENOUS | Status: AC | PRN
  Administered 2021-08-22: 6 mL via INTRAVENOUS

## 2021-08-23 ENCOUNTER — Telehealth: Payer: Self-pay

## 2021-08-23 NOTE — Telephone Encounter (Signed)
-----   Message from Loa Socks, NP sent at 08/22/2021  4:43 PM EDT ----- Mri normal, please notify patient ----- Message ----- From: Interface, Rad Results In Sent: 08/22/2021   2:21 PM EDT To: Lowella Dell, MD

## 2021-08-23 NOTE — Telephone Encounter (Signed)
RN notified patient, no further needs.

## 2021-09-19 ENCOUNTER — Inpatient Hospital Stay: Payer: Managed Care, Other (non HMO) | Admitting: Oncology

## 2022-01-15 ENCOUNTER — Other Ambulatory Visit: Payer: Self-pay | Admitting: Obstetrics and Gynecology

## 2022-01-15 DIAGNOSIS — Z1231 Encounter for screening mammogram for malignant neoplasm of breast: Secondary | ICD-10-CM

## 2022-02-28 ENCOUNTER — Ambulatory Visit
Admission: RE | Admit: 2022-02-28 | Discharge: 2022-02-28 | Disposition: A | Discharge status: Home / Self Care | Payer: Managed Care, Other (non HMO) | Source: Ambulatory Visit

## 2022-02-28 DIAGNOSIS — Z1231 Encounter for screening mammogram for malignant neoplasm of breast: Secondary | ICD-10-CM

## 2022-07-11 ENCOUNTER — Other Ambulatory Visit: Payer: Self-pay | Admitting: *Deleted

## 2022-07-11 DIAGNOSIS — N6092 Unspecified benign mammary dysplasia of left breast: Secondary | ICD-10-CM

## 2022-07-11 DIAGNOSIS — Z9189 Other specified personal risk factors, not elsewhere classified: Secondary | ICD-10-CM

## 2022-07-11 DIAGNOSIS — Z803 Family history of malignant neoplasm of breast: Secondary | ICD-10-CM

## 2022-07-11 NOTE — Progress Notes (Signed)
mri

## 2022-08-21 ENCOUNTER — Ambulatory Visit
Admission: RE | Admit: 2022-08-21 | Discharge: 2022-08-21 | Disposition: A | Discharge status: Home / Self Care | Payer: Managed Care, Other (non HMO) | Source: Ambulatory Visit | Attending: Hematology and Oncology | Admitting: Hematology and Oncology

## 2022-08-21 DIAGNOSIS — Z803 Family history of malignant neoplasm of breast: Secondary | ICD-10-CM

## 2022-08-21 DIAGNOSIS — Z9189 Other specified personal risk factors, not elsewhere classified: Secondary | ICD-10-CM

## 2022-08-21 DIAGNOSIS — N6092 Unspecified benign mammary dysplasia of left breast: Secondary | ICD-10-CM

## 2022-08-21 MED ORDER — GADOBUTROL 1 MMOL/ML IV SOLN
6.0000 mL | Freq: Once | INTRAVENOUS | Status: AC | PRN
  Administered 2022-08-21: 6 mL via INTRAVENOUS

## 2022-09-15 ENCOUNTER — Other Ambulatory Visit: Payer: Self-pay

## 2022-09-15 ENCOUNTER — Encounter: Payer: Self-pay | Admitting: Hematology and Oncology

## 2022-09-15 ENCOUNTER — Inpatient Hospital Stay: Payer: Managed Care, Other (non HMO) | Attending: Hematology and Oncology | Admitting: Hematology and Oncology

## 2022-09-15 VITALS — BP 134/65 | HR 67 | Temp 97.7°F | Resp 18 | Ht 61.0 in | Wt 132.3 lb

## 2022-09-15 DIAGNOSIS — Z803 Family history of malignant neoplasm of breast: Secondary | ICD-10-CM | POA: Insufficient documentation

## 2022-09-15 DIAGNOSIS — N6092 Unspecified benign mammary dysplasia of left breast: Secondary | ICD-10-CM | POA: Insufficient documentation

## 2022-09-15 NOTE — Progress Notes (Signed)
[facility]  Telephone:(336) [phone] Fax:(336) [phone]     ID: Lindsay Black DOB: 1971/10/18  MR#: [id number]  JGG#:[id number]  Patient Care Team: Lindsay Ladd, MD (Inactive) as PCP - General (Family Medicine) Lindsay Miner, MD as Consulting Physician (General Surgery) Black, Lindsay Hue, MD (Inactive) as Consulting Physician (Oncology) Lindsay Camp, MD as Consulting Physician (Obstetrics and Gynecology) Lindsay Level, MD as Consulting Physician (General Surgery) Lindsay Shiver, MD as Consulting Physician (Gastroenterology) Lindsay Moulds, MD OTHER MD:  CHIEF COMPLAINT: Atypical ductal hyperplasia  CURRENT TREATMENT: Intensified screening   INTERVAL HISTORY: Lindsay Black was contacted today for follow up of her atypical uctal hyperplasia. She was evaluated in the high risk clinic on 03/30/2019. Since consultation, she underwent left lumpectomy on 05/25/2020 under Dr. Carolynne Black. Pathology from the procedure ([id number]) was benign. She refused antiestrogen therapy, continues on intensified screening.  Since last visit, she denies any new complaints except for vaginal dryness, loss of libido given dyspareunia.  She was wondering if she can take some estrogen to help with this transition of menopause.  Rest of the pertinent 10 point ROS reviewed and negative    HISTORY OF CURRENT ILLNESS: From the original intake note:  Lindsay Black had routine screening mammography on 01/17/2019 showing a possible abnormality in the left breast. She underwent bilateral diagnostic mammography with tomography and left breast ultrasonography at [facility] on 01/21/2019 showing: breast density category C; questionable, persistent distortion is seen on the CC projection in the central left breast; no suspicious left axillary lymphadenopathy.  Accordingly on 01/26/2019 she proceeded to biopsy of the left breast area in question. The pathology from this procedure ([id number]) showed: atypical ductal  hyperplasia arising in a complex sclerosing lesion.   Her definitive surgery is being postponed because of the current pandemic.  The patient's subsequent history is as detailed below.   PAST MEDICAL HISTORY: Past Medical History:  Diagnosis Date   Anxiety    Family history of breast cancer    Family history of colon cancer   Thyroid nodules   PAST SURGICAL HISTORY: Past Surgical History:  Procedure Laterality Date   BREAST BIOPSY Right 08/2019   [facility]   BREAST EXCISIONAL BIOPSY Left 05/2019   Negative, atypical ductal hyperplasia   BREAST LUMPECTOMY WITH RADIOACTIVE SEED LOCALIZATION Left 05/26/2019   Procedure: LEFT BREAST LUMPECTOMY WITH RADIOACTIVE SEED LOCALIZATION;  Surgeon: Lindsay Miner, MD;  Location: [facility];  Service: General;  Laterality: Left;   CESAREAN SECTION     WISDOM TOOTH EXTRACTION      FAMILY HISTORY Family History  Problem Relation Age of Onset   Prostate cancer Father        brachytherapy   Throat cancer Father        d. 21   Colon cancer Maternal Uncle        dx mid 52s   Breast cancer Maternal Grandmother        dx early 28s; d. 95   Colon cancer Maternal Grandfather 17       d. 60   Dementia Paternal Grandmother    Throat cancer Paternal Grandfather        d. 30s   Metabolic syndrome Daughter        methylmalonic aciduria   As of April 2020, patient's father is alive at 13 years old.  He has a history of prostate cancer.  Patient's mother is also alive at age 43. Maternal grandmother was diagnosed with  breast cancer in her early 53s.  The patient has 1 sister, no brothers.    GYNECOLOGIC HISTORY:  No LMP recorded. Patient is perimenopausal. Menarche: 51 years old Age at first live birth: 51 years old Intercourse P 2 LMP within the last month Contraceptive currently on oral contraceptives HRT husband is status post vasectomy Hysterectomy? no BSO? 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 She is married. (Second) husband Lindsay Black is a Government social research officer for [employer]. Lindsay Black has two children from a previous marriage, ages 66 and 3, that live with them 50% of the time and with their mother the other 50%.  Lindsay Black's 2 children are Lindsay Black, 21 year old, who lives in [place] and works in Chiropodist, and Lindsay Black, 52 year old, attending APP. at home also there are 4 dogs and 2 cats.  The patient is a Tourist information centre manager.    ADVANCED DIRECTIVES: Her sister, Lindsay Black, is her HCPOA.  She lives in [place] and can be reached at [phone]   HEALTH MAINTENANCE: Social History   Tobacco Use   Smoking status: Never   Smokeless tobacco: Never  Vaping Use   Vaping Use: Never used  Substance Use Topics   Alcohol use: Yes    Comment: occas   Drug use: Never     Colonoscopy: Jan 2020, repeat in 5 years  PAP: 01/17/2019, negative, Lindsay Black  Bone density: Jan 2020, osteopenia   No Known Allergies  Current Outpatient Medications  Medication Sig Dispense Refill   sertraline (ZOLOFT) 50 MG tablet Take 50 mg by mouth daily. Takes 1.5 tablets daily     No current facility-administered medications for this visit.    OBJECTIVE:   Vitals:   09/15/22 1325  BP: 119/69  Pulse: 74  Resp: 18  Temp: 97.7 F (36.5 C)  SpO2: 98%     Body mass index is 33.1 kg/m.   Wt Readings from Last 3 Encounters:  09/15/22 175 lb 3.2 oz (79.5 kg)  05/26/19 133 lb 6.1 oz (60.5 kg)  03/31/19 134 lb 12.8 oz (61.1 kg)      ECOG FS:0 - Asymptomatic  Physical Exam Chest:       Comments: Small area of abnormal density likely post procedural scar noted in this area.  No other regional adenopathy      LAB RESULTS:  CMP  No results found for: "NA", "K", "CL", "CO2", "GLUCOSE", "BUN", "CREATININE", "CALCIUM", "PROT", "ALBUMIN", "AST", "ALT", "ALKPHOS", "BILITOT", "GFRNONAA", "GFRAA"  No results found for: "TOTALPROTELP", "ALBUMINELP", "A1GS", "A2GS", "BETS", "BETA2SER", "GAMS",  "MSPIKE", "SPEI"  No results found for: "KPAFRELGTCHN", "LAMBDASER", "KAPLAMBRATIO"  No results found for: "WBC", "NEUTROABS", "HGB", "HCT", "MCV", "PLT"  No results found for: "LABCA2"  No components found for: "HYIFOY774"  No results for input(s): "INR" in the last 168 hours.  No results found for: "LABCA2"  No results found for: "JOI786"  No results found for: "CAN125"  No results found for: "CAN153"  No results found for: "CA2729"  No components found for: "HGQUANT"  No results found for: "CEA1", "CEA" / No results found for: "CEA1", "CEA"   No results found for: "AFPTUMOR"  No results found for: "CHROMOGRNA"  No results found for: "HGBA", "HGBA2QUANT", "HGBFQUANT", "HGBSQUAN" (Hemoglobinopathy evaluation)   No results found for: "LDH"  No results found for: "IRON", "TIBC", "IRONPCTSAT" (Iron and TIBC)  No results found for: "FERRITIN"  Urinalysis No results found for: "COLORURINE", "APPEARANCEUR", "LABSPEC", "PHURINE", "GLUCOSEU", "HGBUR", "BILIRUBINUR", "KETONESUR", "  PROTEINUR", "UROBILINOGEN", "NITRITE", "LEUKOCYTESUR"   STUDIES: MR BREAST BILATERAL W WO CONTRAST INC CAD  Result Date: 08/21/2022 CLINICAL DATA:  High risk screening MRI due to strong family history. She has history of biopsy of the left breast in February of 2020 demonstrating a complex sclerosing lesion with ADH. Final pathology from excisional biopsy demonstrated fibrocystic changes. She has history of a prior benign ultrasound-guided biopsy of the right breast. EXAM: BILATERAL BREAST MRI WITH AND WITHOUT CONTRAST TECHNIQUE: Multiplanar, multisequence MR images of both breasts were obtained prior to and following the intravenous administration of 6 ml of Gadavist Three-dimensional MR images were rendered by post-processing of the original MR data on an independent workstation. The three-dimensional MR images were interpreted, and findings are reported in the following complete MRI report for this  study. Three dimensional images were evaluated at the independent interpreting workstation using the DynaCAD thin client. COMPARISON:  Previous exam(s). FINDINGS: Breast composition: c. Heterogeneous fibroglandular tissue. Background parenchymal enhancement: Minimal Right breast: No mass or abnormal enhancement. Left breast: No mass or abnormal enhancement. Lymph nodes: No abnormal appearing lymph nodes. Ancillary findings: There is a stable T2 bright nonenhancing mass in the liver, compatible with a cyst. IMPRESSION: 1.  No MRI evidence of malignancy in the bilateral breasts. RECOMMENDATION: 1.  Annual screening mammography is due in March of 2024. 2. Per American Cancer Society guidelines, if the patient has a calculated lifetime risk of developing breast cancer of greater than 20%, annual screening MRI of the breasts would be recommended. BI-RADS CATEGORY  1: Negative. Electronically Signed   By: Frederico Hamman M.D.   On: 08/21/2022 12:53    ELIGIBLE FOR AVAILABLE RESEARCH PROTOCOL: no  ASSESSMENT: 51 y.o. [place] woman status post left breast biopsy 01/26/2019 showing atypical ductal hyperplasia  (1) she is s/p lumpectomy June 2020  (2) opted against risk reduction with antiestrogens  (3) intensified screening with breast MRI August 2020 and yearly, in addition to yearly mammography   PLAN:  Since her last visit she had MRI of the breast done on August 21, 2022 with no evidence of malignancy in bilateral breasts Mammogram of March 2023 with no mammographic evidence of malignancy No concerns on physical exam except for a small cystic area in the left breast around 12 o'clock position, We will get an ultrasound of this region to evaluate further. If this is indeed unremarkable, she will continue mammograms alternating with MRIs and return to clinic in 1 year or sooner as needed. With regards to dyspareunia, loss of libido and vaginal dryness, we have discussed about measures such as  hyaluronic acid/Replens, coconut oil suppositories, pelvic floor rehabilitation and if she does not have any improvement with either of these measures, she can resort to vaginal estrogen which may have minimal absorption into the systemic circulation.  She will start with the Replens and coconut oil suppositories and will also discuss with her gynecology for further recommendations.  Lindsay Moulds, MD   09/15/2022 1:47 PM Medical Oncology and Hematology [facility] [address] [place], [place] [address] Tel. [phone]    Fax. [phone]

## 2022-09-19 ENCOUNTER — Encounter: Payer: Self-pay | Admitting: Hematology and Oncology

## 2022-09-29 ENCOUNTER — Ambulatory Visit
Admission: RE | Admit: 2022-09-29 | Discharge: 2022-09-29 | Disposition: A | Discharge status: Home / Self Care | Payer: Managed Care, Other (non HMO) | Source: Ambulatory Visit | Attending: Hematology and Oncology | Admitting: Hematology and Oncology

## 2022-09-29 DIAGNOSIS — N6092 Unspecified benign mammary dysplasia of left breast: Secondary | ICD-10-CM

## 2023-01-16 ENCOUNTER — Other Ambulatory Visit: Payer: Self-pay | Admitting: Obstetrics and Gynecology

## 2023-01-16 DIAGNOSIS — Z1231 Encounter for screening mammogram for malignant neoplasm of breast: Secondary | ICD-10-CM

## 2023-03-04 ENCOUNTER — Ambulatory Visit
Admission: RE | Admit: 2023-03-04 | Discharge: 2023-03-04 | Disposition: A | Discharge status: Home / Self Care | Payer: Managed Care, Other (non HMO) | Source: Ambulatory Visit

## 2023-03-04 DIAGNOSIS — Z1231 Encounter for screening mammogram for malignant neoplasm of breast: Secondary | ICD-10-CM

## 2023-03-16 ENCOUNTER — Inpatient Hospital Stay: Payer: Managed Care, Other (non HMO) | Admitting: Hematology and Oncology

## 2023-08-05 ENCOUNTER — Other Ambulatory Visit: Payer: Self-pay

## 2023-08-05 ENCOUNTER — Encounter: Payer: Self-pay | Admitting: Hematology and Oncology

## 2023-08-05 DIAGNOSIS — Z803 Family history of malignant neoplasm of breast: Secondary | ICD-10-CM

## 2023-08-05 DIAGNOSIS — Z9189 Other specified personal risk factors, not elsewhere classified: Secondary | ICD-10-CM

## 2023-08-05 DIAGNOSIS — N6092 Unspecified benign mammary dysplasia of left breast: Secondary | ICD-10-CM

## 2023-09-16 ENCOUNTER — Ambulatory Visit
Admission: RE | Admit: 2023-09-16 | Discharge: 2023-09-16 | Disposition: A | Discharge status: Home / Self Care | Payer: Managed Care, Other (non HMO) | Source: Ambulatory Visit | Attending: Hematology and Oncology

## 2023-09-16 DIAGNOSIS — N6092 Unspecified benign mammary dysplasia of left breast: Secondary | ICD-10-CM

## 2023-09-16 DIAGNOSIS — Z9189 Other specified personal risk factors, not elsewhere classified: Secondary | ICD-10-CM

## 2023-09-16 DIAGNOSIS — Z803 Family history of malignant neoplasm of breast: Secondary | ICD-10-CM

## 2023-09-16 MED ORDER — GADOPICLENOL 0.5 MMOL/ML IV SOLN
5.0000 mL | Freq: Once | INTRAVENOUS | Status: AC | PRN
  Administered 2023-09-16: 5 mL via INTRAVENOUS

## 2024-02-03 ENCOUNTER — Other Ambulatory Visit: Payer: Self-pay | Admitting: Obstetrics and Gynecology

## 2024-02-03 DIAGNOSIS — Z1231 Encounter for screening mammogram for malignant neoplasm of breast: Secondary | ICD-10-CM

## 2024-03-04 ENCOUNTER — Ambulatory Visit
Admission: RE | Admit: 2024-03-04 | Discharge: 2024-03-04 | Disposition: A | Discharge status: Home / Self Care | Payer: Managed Care, Other (non HMO) | Source: Ambulatory Visit

## 2024-03-04 DIAGNOSIS — Z1231 Encounter for screening mammogram for malignant neoplasm of breast: Secondary | ICD-10-CM

## 2024-09-02 ENCOUNTER — Other Ambulatory Visit: Payer: Self-pay | Admitting: *Deleted

## 2024-09-02 DIAGNOSIS — Z9189 Other specified personal risk factors, not elsewhere classified: Secondary | ICD-10-CM

## 2024-09-02 DIAGNOSIS — N6092 Unspecified benign mammary dysplasia of left breast: Secondary | ICD-10-CM

## 2024-10-01 ENCOUNTER — Ambulatory Visit
Admission: RE | Admit: 2024-10-01 | Discharge: 2024-10-01 | Disposition: A | Source: Ambulatory Visit | Attending: Hematology and Oncology

## 2024-10-01 DIAGNOSIS — N6092 Unspecified benign mammary dysplasia of left breast: Secondary | ICD-10-CM

## 2024-10-01 DIAGNOSIS — Z9189 Other specified personal risk factors, not elsewhere classified: Secondary | ICD-10-CM

## 2024-10-01 MED ORDER — GADOPICLENOL 0.5 MMOL/ML IV SOLN
6.0000 mL | Freq: Once | INTRAVENOUS | Status: AC | PRN
  Administered 2024-10-01: 6 mL via INTRAVENOUS

## 2025-03-06 ENCOUNTER — Encounter

## 2025-03-06 DIAGNOSIS — Z1231 Encounter for screening mammogram for malignant neoplasm of breast: Secondary | ICD-10-CM
